# Patient Record
Sex: Female | Born: 1970 | Race: Black or African American | Hispanic: No | Marital: Single | Smoking: Never smoker
Health system: Southern US, Community
[De-identification: ages and names within clinical notes are randomized; demographics above are authoritative.]

## PROBLEM LIST (undated history)

## (undated) DIAGNOSIS — C50919 Malignant neoplasm of unspecified site of unspecified female breast: Secondary | ICD-10-CM

## (undated) HISTORY — PX: BREAST SURGERY: SHX581

---

## 2020-11-28 DIAGNOSIS — C50812 Malignant neoplasm of overlapping sites of left female breast: Secondary | ICD-10-CM

## 2020-11-28 DIAGNOSIS — Z170 Estrogen receptor positive status [ER+]: Secondary | ICD-10-CM

## 2021-10-08 ENCOUNTER — Inpatient Hospital Stay (HOSPITAL_COMMUNITY)
Admission: EM | Admit: 2021-10-08 | Discharge: 2021-10-13 | DRG: 025 | Disposition: A | Discharge status: Home / Self Care | Payer: 59 | Attending: Internal Medicine | Admitting: Internal Medicine

## 2021-10-08 ENCOUNTER — Emergency Department (HOSPITAL_COMMUNITY): Payer: 59

## 2021-10-08 ENCOUNTER — Encounter (HOSPITAL_COMMUNITY): Payer: Self-pay | Admitting: Internal Medicine

## 2021-10-08 ENCOUNTER — Other Ambulatory Visit: Payer: Self-pay

## 2021-10-08 DIAGNOSIS — Z6831 Body mass index (BMI) 31.0-31.9, adult: Secondary | ICD-10-CM

## 2021-10-08 DIAGNOSIS — E669 Obesity, unspecified: Secondary | ICD-10-CM | POA: Diagnosis present

## 2021-10-08 DIAGNOSIS — R739 Hyperglycemia, unspecified: Secondary | ICD-10-CM | POA: Diagnosis not present

## 2021-10-08 DIAGNOSIS — C7931 Secondary malignant neoplasm of brain: Principal | ICD-10-CM | POA: Diagnosis present

## 2021-10-08 DIAGNOSIS — G936 Cerebral edema: Secondary | ICD-10-CM | POA: Diagnosis present

## 2021-10-08 DIAGNOSIS — T380X5A Adverse effect of glucocorticoids and synthetic analogues, initial encounter: Secondary | ICD-10-CM | POA: Diagnosis not present

## 2021-10-08 DIAGNOSIS — Z20822 Contact with and (suspected) exposure to covid-19: Secondary | ICD-10-CM | POA: Diagnosis present

## 2021-10-08 DIAGNOSIS — W1830XA Fall on same level, unspecified, initial encounter: Secondary | ICD-10-CM | POA: Diagnosis present

## 2021-10-08 DIAGNOSIS — Z66 Do not resuscitate: Secondary | ICD-10-CM | POA: Diagnosis present

## 2021-10-08 DIAGNOSIS — G9389 Other specified disorders of brain: Secondary | ICD-10-CM

## 2021-10-08 DIAGNOSIS — G40909 Epilepsy, unspecified, not intractable, without status epilepticus: Secondary | ICD-10-CM | POA: Diagnosis present

## 2021-10-08 DIAGNOSIS — Z79899 Other long term (current) drug therapy: Secondary | ICD-10-CM | POA: Diagnosis not present

## 2021-10-08 DIAGNOSIS — Y929 Unspecified place or not applicable: Secondary | ICD-10-CM

## 2021-10-08 DIAGNOSIS — E872 Acidosis, unspecified: Secondary | ICD-10-CM | POA: Diagnosis present

## 2021-10-08 DIAGNOSIS — Z170 Estrogen receptor positive status [ER+]: Secondary | ICD-10-CM | POA: Diagnosis not present

## 2021-10-08 DIAGNOSIS — R569 Unspecified convulsions: Secondary | ICD-10-CM | POA: Diagnosis present

## 2021-10-08 DIAGNOSIS — C50812 Malignant neoplasm of overlapping sites of left female breast: Secondary | ICD-10-CM | POA: Diagnosis not present

## 2021-10-08 HISTORY — DX: Malignant neoplasm of unspecified site of unspecified female breast: C50.919

## 2021-10-08 LAB — CBC WITH DIFFERENTIAL/PLATELET
Abs Immature Granulocytes: 0.02 10*3/uL (ref 0.00–0.07)
Basophils Absolute: 0 10*3/uL (ref 0.0–0.1)
Basophils Relative: 0 %
Eosinophils Absolute: 0 10*3/uL (ref 0.0–0.5)
Eosinophils Relative: 1 %
HCT: 38.8 % (ref 36.0–46.0)
Hemoglobin: 12.9 g/dL (ref 12.0–15.0)
Immature Granulocytes: 0 %
Lymphocytes Relative: 13 %
Lymphs Abs: 0.6 10*3/uL — ABNORMAL LOW (ref 0.7–4.0)
MCH: 31.7 pg (ref 26.0–34.0)
MCHC: 33.2 g/dL (ref 30.0–36.0)
MCV: 95.3 fL (ref 80.0–100.0)
Monocytes Absolute: 0.3 10*3/uL (ref 0.1–1.0)
Monocytes Relative: 6 %
Neutro Abs: 4 10*3/uL (ref 1.7–7.7)
Neutrophils Relative %: 80 %
Platelets: 197 10*3/uL (ref 150–400)
RBC: 4.07 MIL/uL (ref 3.87–5.11)
RDW: 12.9 % (ref 11.5–15.5)
WBC: 5 10*3/uL (ref 4.0–10.5)
nRBC: 0 % (ref 0.0–0.2)

## 2021-10-08 LAB — COMPREHENSIVE METABOLIC PANEL
ALT: 10 U/L (ref 0–44)
AST: 44 U/L — ABNORMAL HIGH (ref 15–41)
Albumin: 3.7 g/dL (ref 3.5–5.0)
Alkaline Phosphatase: 54 U/L (ref 38–126)
Anion gap: 15 (ref 5–15)
BUN: 12 mg/dL (ref 6–20)
CO2: 18 mmol/L — ABNORMAL LOW (ref 22–32)
Calcium: 9.3 mg/dL (ref 8.9–10.3)
Chloride: 104 mmol/L (ref 98–111)
Creatinine, Ser: 0.9 mg/dL (ref 0.44–1.00)
GFR, Estimated: >60 mL/min (ref >60)
Glucose, Bld: 128 mg/dL — ABNORMAL HIGH (ref 70–99)
Potassium: 4.6 mmol/L (ref 3.5–5.1)
Sodium: 137 mmol/L (ref 135–145)
Total Bilirubin: 1.5 mg/dL — ABNORMAL HIGH (ref 0.3–1.2)
Total Protein: 6.8 g/dL (ref 6.5–8.1)

## 2021-10-08 LAB — I-STAT BETA HCG BLOOD, ED (MC, WL, AP ONLY): I-stat hCG, quantitative: <5 m[IU]/mL (ref <5)

## 2021-10-08 LAB — I-STAT CHEM 8, ED
BUN: 15 mg/dL (ref 6–20)
Calcium, Ion: 1.13 mmol/L — ABNORMAL LOW (ref 1.15–1.40)
Chloride: 106 mmol/L (ref 98–111)
Creatinine, Ser: 0.8 mg/dL (ref 0.44–1.00)
Glucose, Bld: 136 mg/dL — ABNORMAL HIGH (ref 70–99)
HCT: 38 % (ref 36.0–46.0)
Hemoglobin: 12.9 g/dL (ref 12.0–15.0)
Potassium: 4.3 mmol/L (ref 3.5–5.1)
Sodium: 138 mmol/L (ref 135–145)
TCO2: 23 mmol/L (ref 22–32)

## 2021-10-08 LAB — ETHANOL: Alcohol, Ethyl (B): <10 mg/dL (ref <10)

## 2021-10-08 LAB — RESP PANEL BY RT-PCR (FLU A&B, COVID) ARPGX2
Influenza A by PCR: NEGATIVE
Influenza B by PCR: NEGATIVE
SARS Coronavirus 2 by RT PCR: NEGATIVE

## 2021-10-08 LAB — CBG MONITORING, ED: Glucose-Capillary: 133 mg/dL — ABNORMAL HIGH (ref 70–99)

## 2021-10-08 IMAGING — CT CT HEAD W/O CM
3 of 4 series · 14 of 47 positions shown, 16 images · non-contrast
Comparison: None.

CLINICAL DATA: Mental status change, unknown cause possible
seizure, h/o breast cancer



[Series 5: head 3.0 mpr cor · coronal · 0.33mm/px · 3 of 69 slices shown]
[im 23/69  brain]
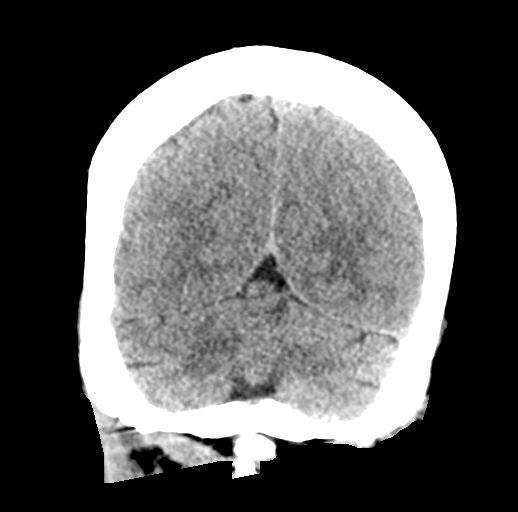
[im 31/69  brain]
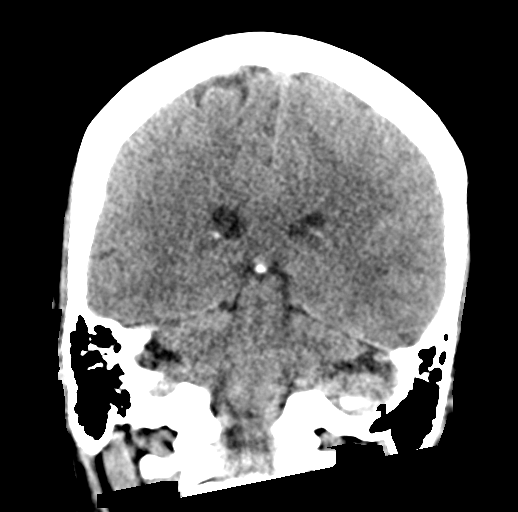
[im 38/69  brain]
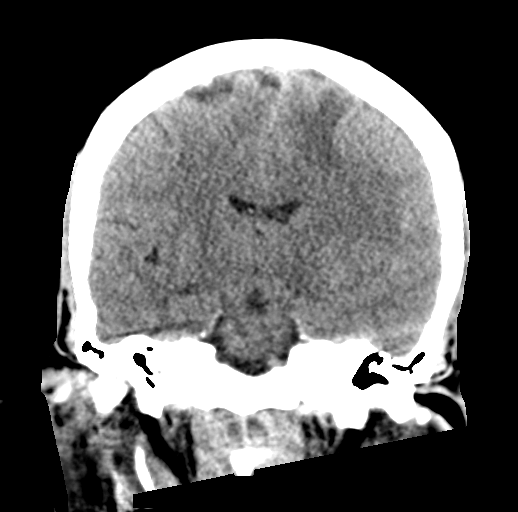

[Series 6: head 3.0 mpr sag · sagittal · 0.34mm/px · 3 of 61 slices shown]
[im 23/61  brain]
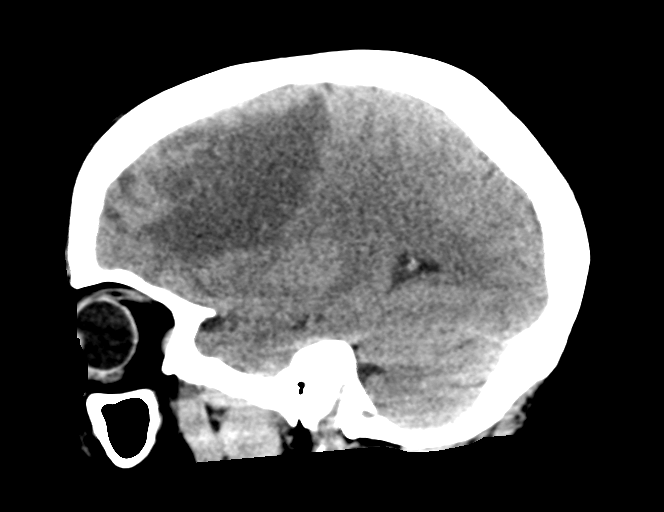
[im 31/61  brain]
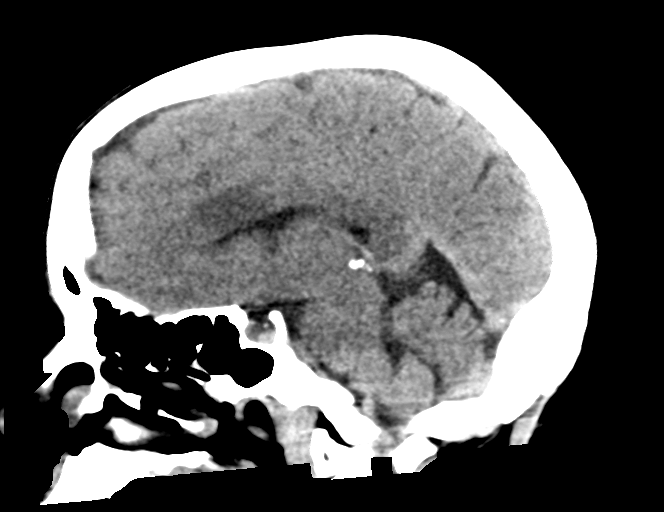
[im 38/61  brain]
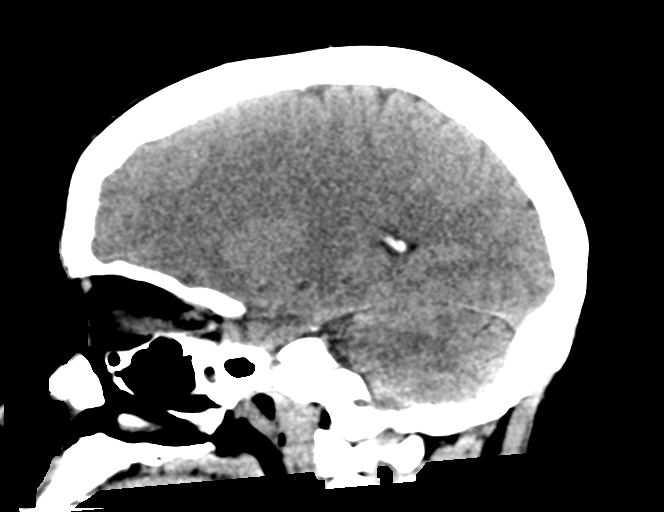

[Series 7: true axial · axial · 0.32mm/px · z∈[-98,+40]mm · 8 of 1701 slices shown, 10 images]
[im 122/1701  brain]
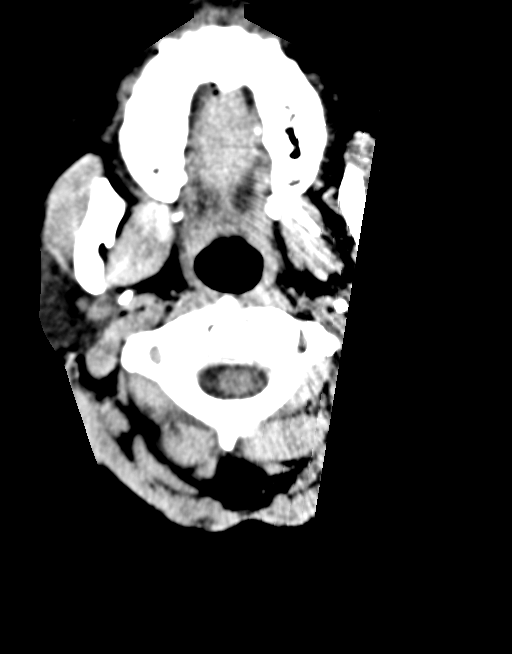
[im 122/1701  bone]
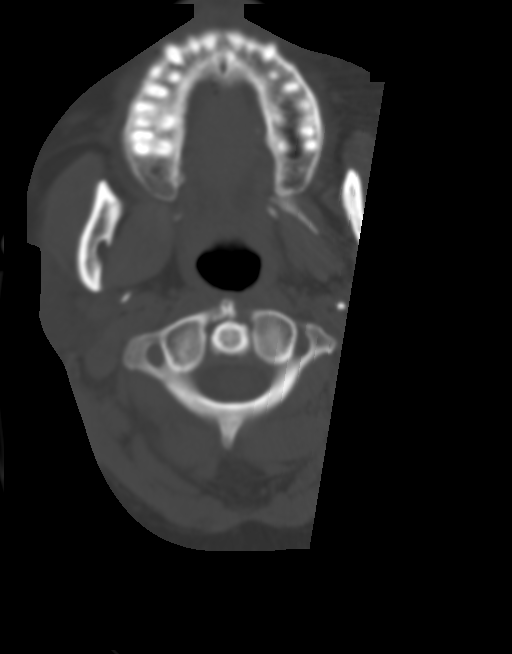
[im 365/1701  brain]
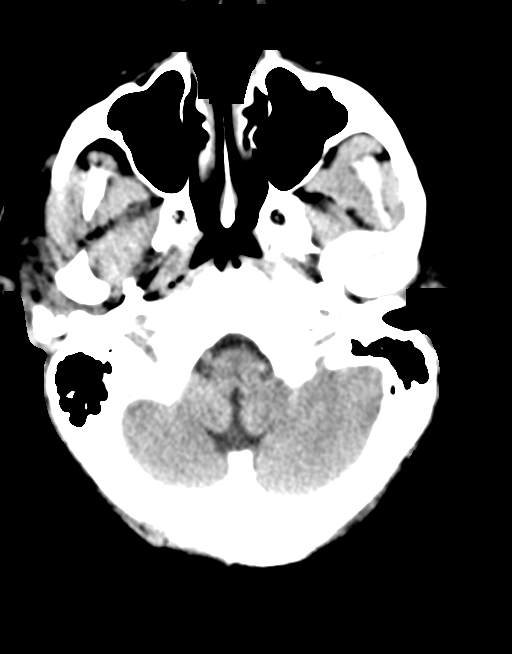
[im 547/1701  brain]
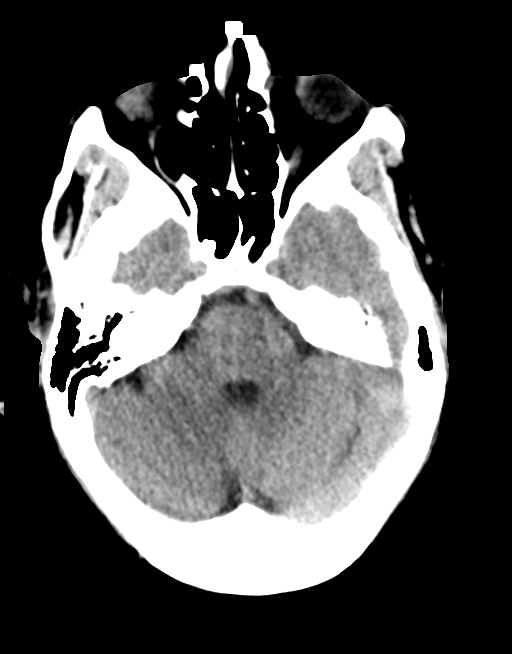
[im 729/1701  brain]
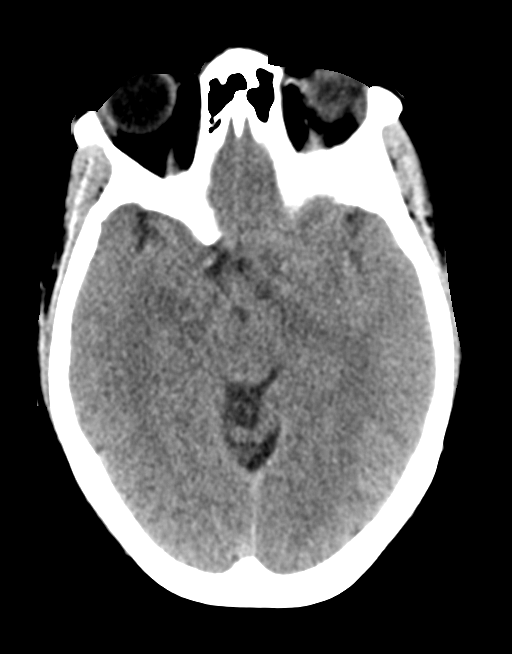
[im 972/1701  brain]
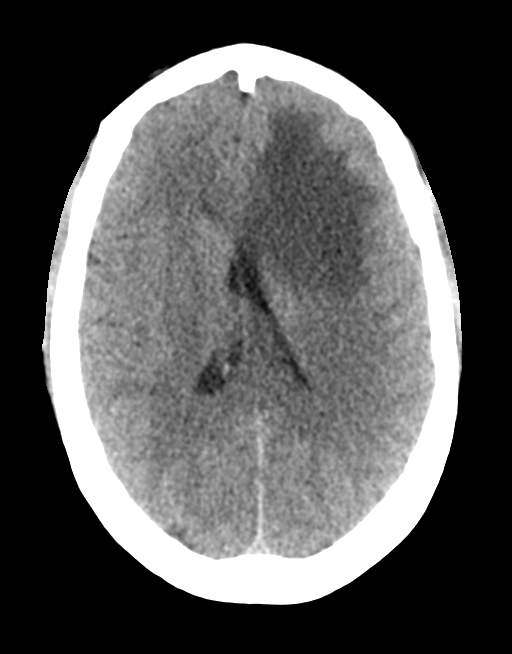
[im 972/1701  bone]
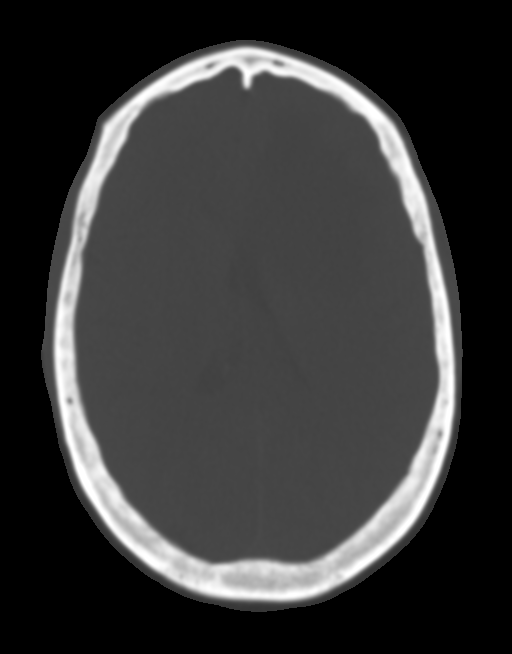
[im 1154/1701  brain]
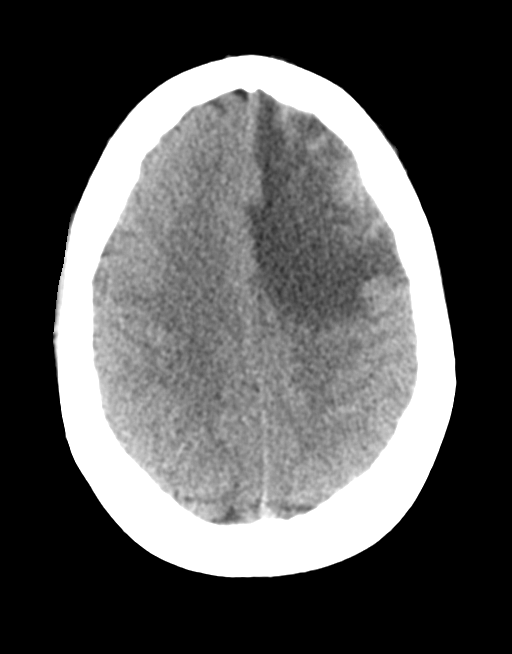
[im 1336/1701  brain]
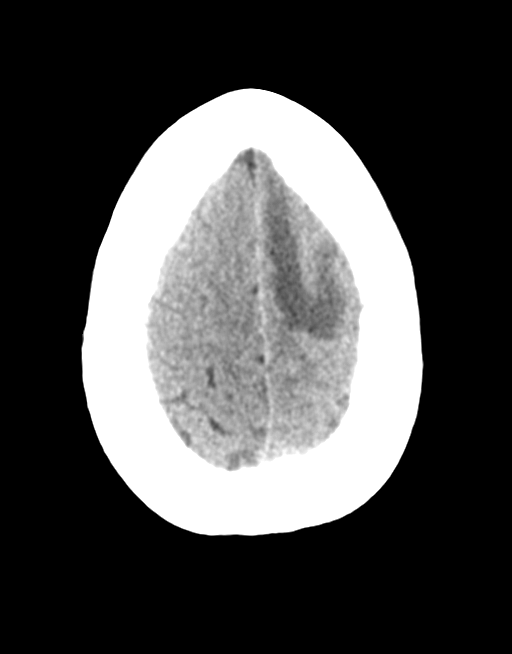
[im 1579/1701  brain]
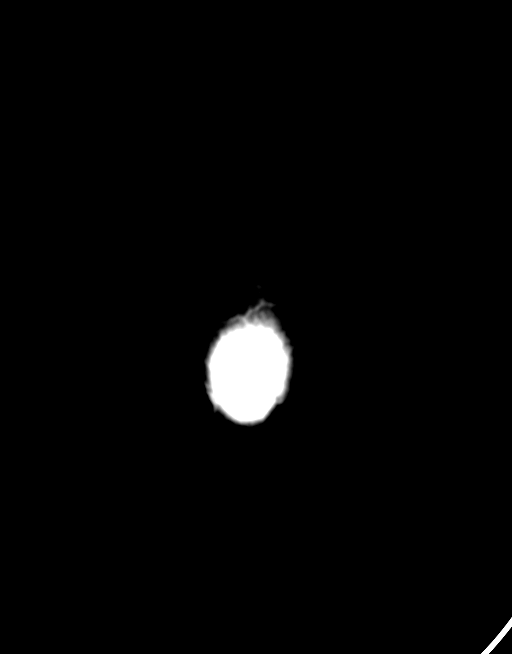

[14 of 47 positions shown; findings below may reference images not displayed]

FINDINGS: Brain: There is significant edema in the left frontal lobe. There
could be some loss of gray differentiation is region. Effacement of
adjacent ventricles with rightward midline shift measuring 4 mm. No
hydrocephalus.

Remainder of brain is unremarkable. No acute intracranial
hemorrhage.

Vascular: There is mild atherosclerotic calcification at the skull
base.

Skull: Calvarium is unremarkable.

Sinuses/Orbits: No acute finding.

Other: None.
IMPRESSION: Left frontal edema, probably vasogenic and secondary to an
underlying metastasis given history. Regional mass effect is present
with mild rightward midline shift. Contrast enhanced MRI is
recommended.

These results were called by telephone at the time of interpretation
on [DATE] at [DATE] to provider JIM , who verbally
acknowledged these results.

## 2021-10-08 IMAGING — CT CT CERVICAL SPINE W/O CM
3 of 4 series · 13 of 33 positions shown, 16 images · non-contrast
Comparison: None.

CLINICAL DATA: Neck trauma, dangerous injury mechanism (Age 16-64y)



[Series 5: c_spine 2.0 st · axial · 0.40mm/px · z∈[-241,-123]mm · 5 of 89 slices shown, 7 images]
[im 15/89  soft-tissue]
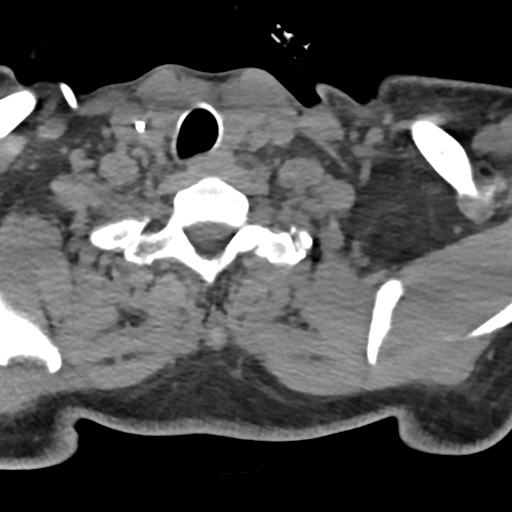
[im 15/89  bone]
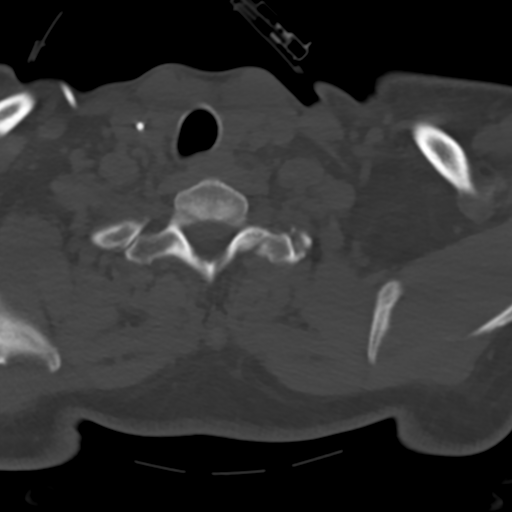
[im 30/89  bone]
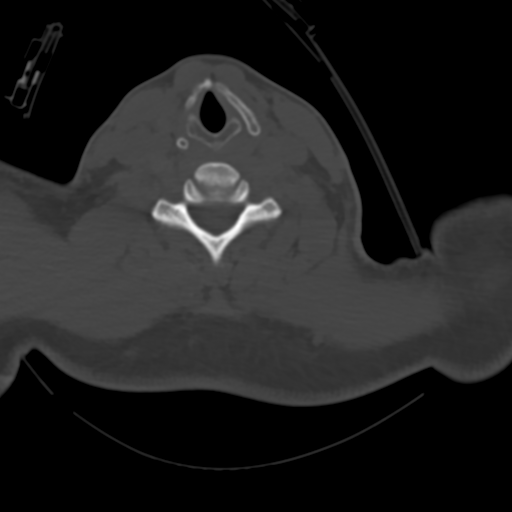
[im 45/89  bone]
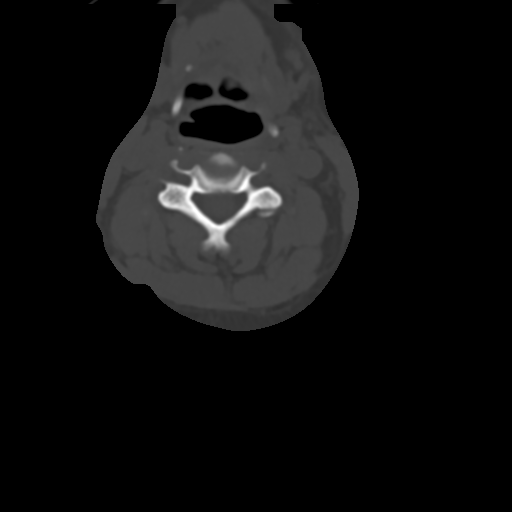
[im 59/89  bone]
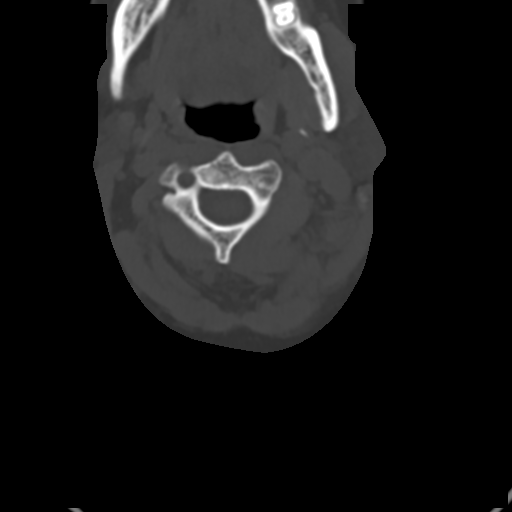
[im 74/89  soft-tissue]
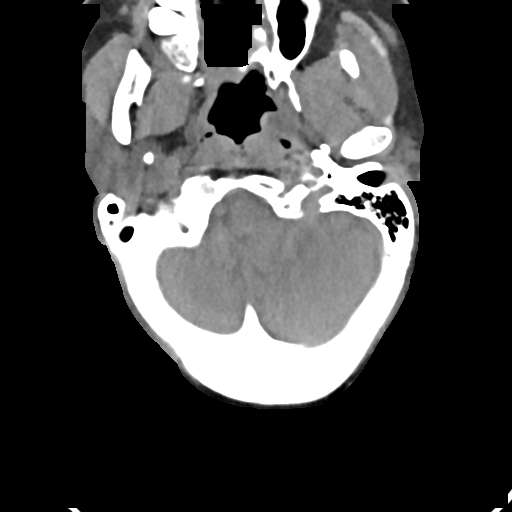
[im 74/89  bone]
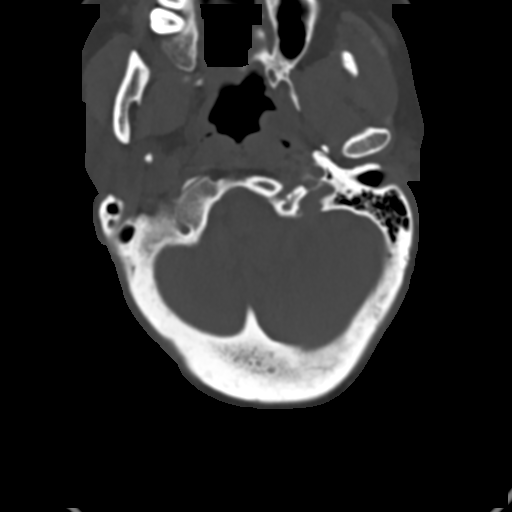

[Series 6: coronal bone · coronal · 0.31mm/px · 3 of 61 slices shown]
[im 13/61  bone]
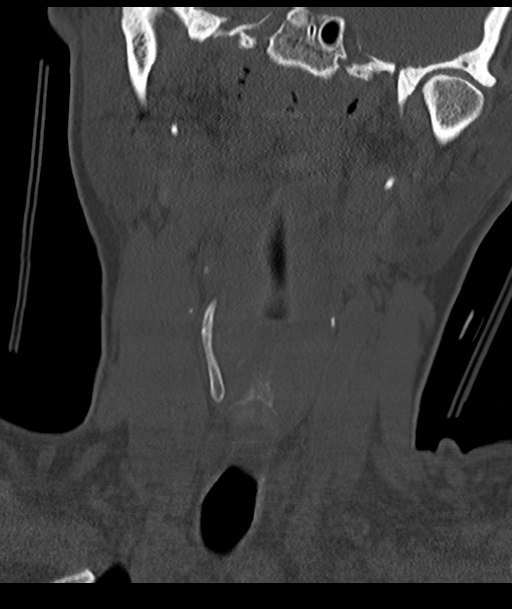
[im 25/61  bone]
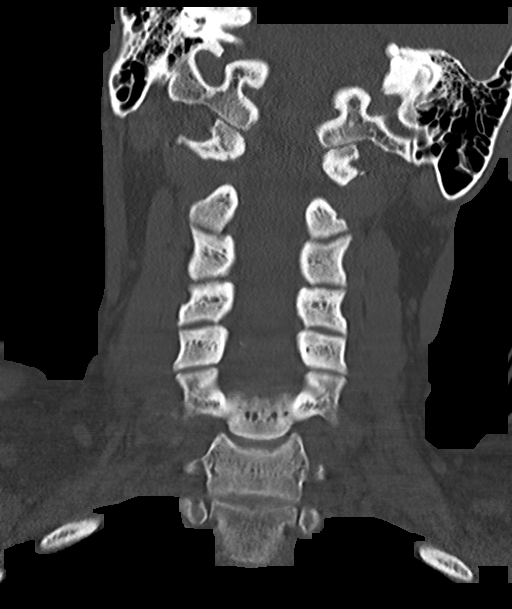
[im 37/61  bone]
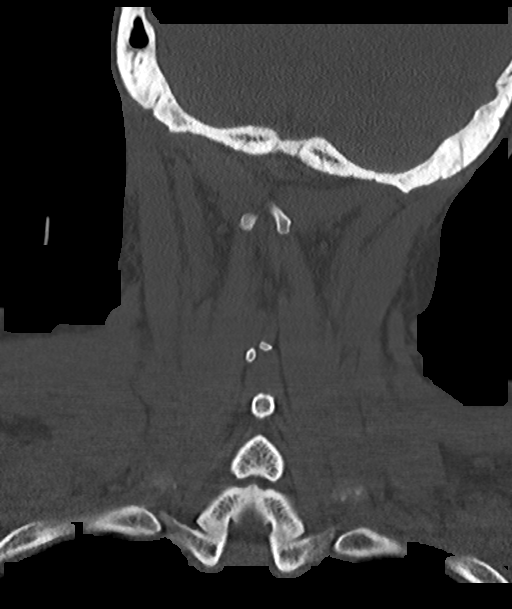

[Series 7: sagittal bone · sagittal · 0.26mm/px · 5 of 61 slices shown, 6 images]
[im 21/61  bone]
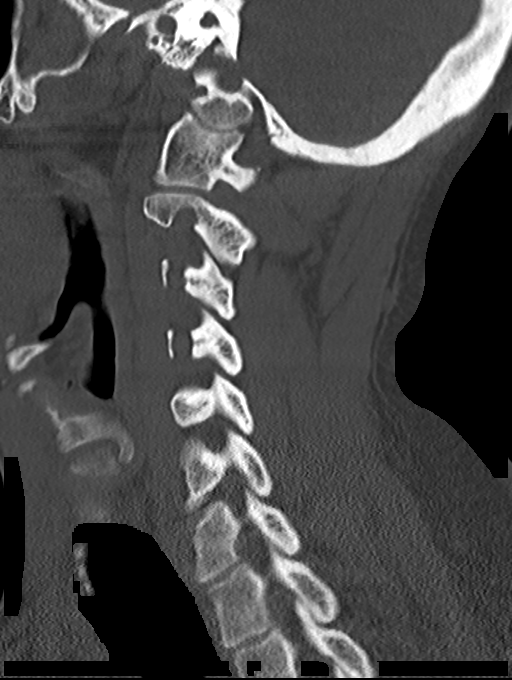
[im 26/61  bone]
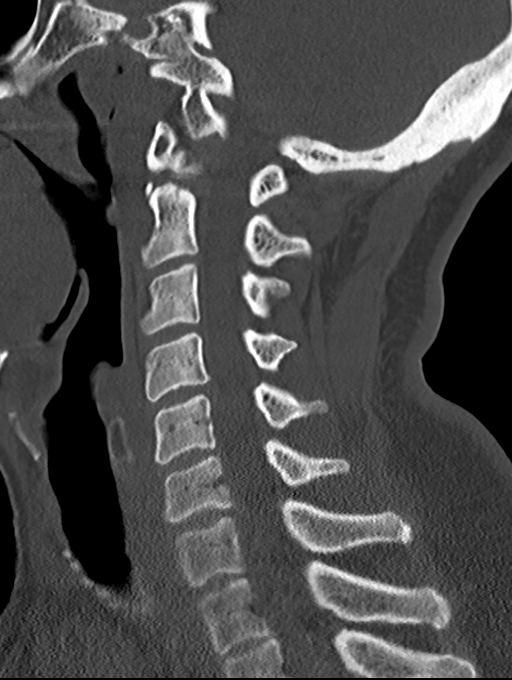
[im 31/61  soft-tissue]
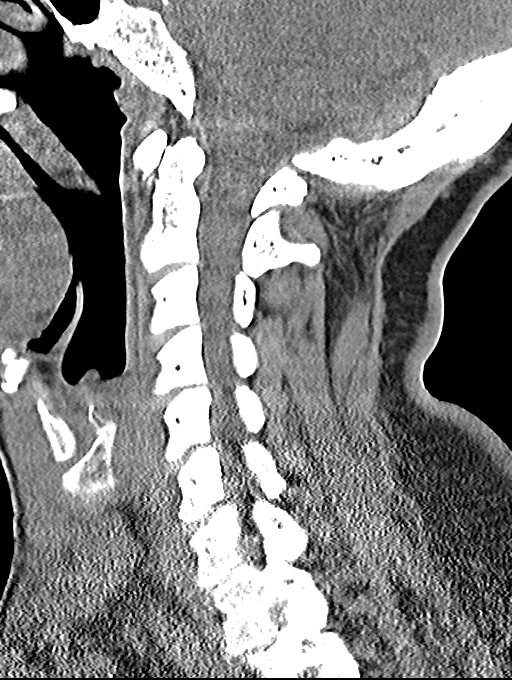
[im 31/61  bone]
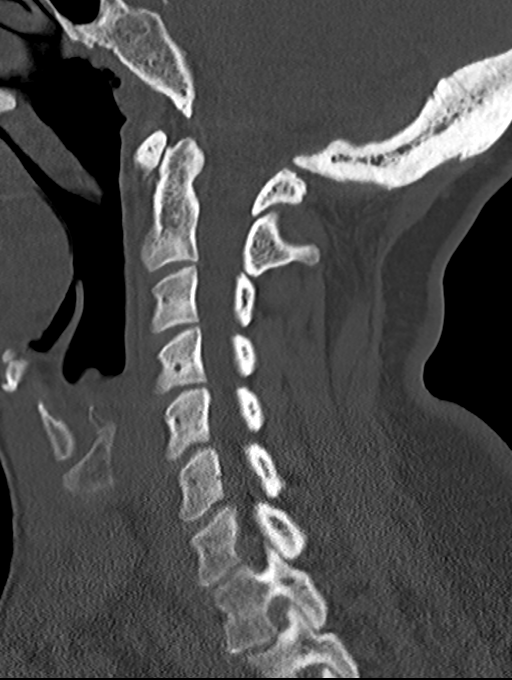
[im 36/61  bone]
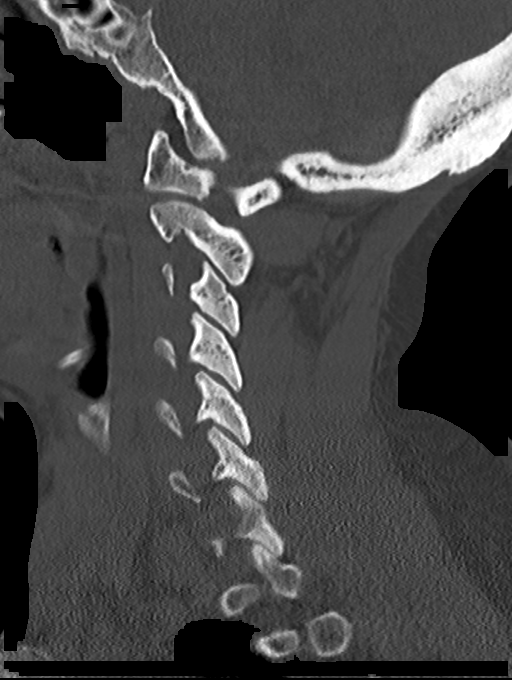
[im 41/61  bone]
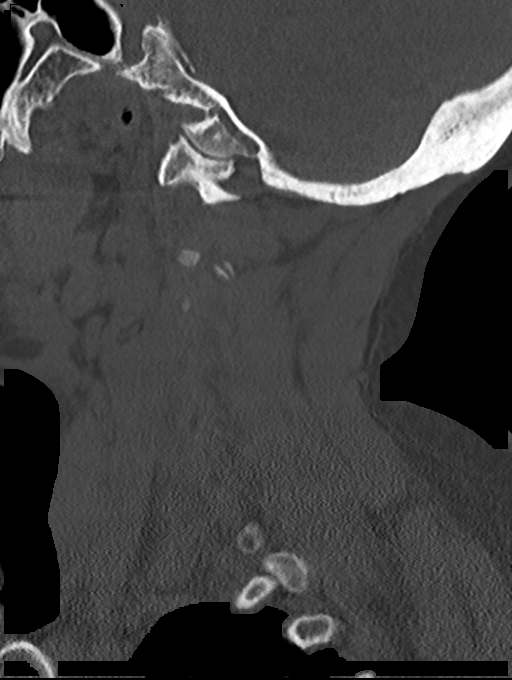

[13 of 33 positions shown; findings below may reference images not displayed]

FINDINGS: Alignment: Preserved.

Skull base and vertebrae: Vertebral body heights are maintained. No
acute fracture. No destructive osseous lesion.

Soft tissues and spinal canal: No prevertebral fluid or swelling. No
visible canal hematoma.

Disc levels:  Mild degenerative changes at C4-C5.

Upper chest: No apical lung mass.

Other: Partially imaged right chest wall port catheter.
IMPRESSION: No acute cervical spine fracture.

## 2021-10-08 IMAGING — MR MR HEAD WO/W CM
18 of 20 series · 42 of 48 positions shown · IV contrast (gadavist)
Comparison: [DATE]

CLINICAL DATA: Mental status change, history of breast cancer,
seizure, possible metastasis on head CT

EXAM:
MRI HEAD WITHOUT AND WITH CONTRAST
TECHNIQUE: Multiplanar, multiecho pulse sequences of the brain and surrounding
structures were obtained without and with intravenous contrast.
CONTRAST:  7.5mL GADAVIST GADOBUTROL 1 MMOL/ML IV SOLN

[Series 5: DWI · axial · 3.0mm · 0.88mm/px · z∈[-132,+5]mm · 5 of 96 slices shown (1 of 4)]
[im 1/96]
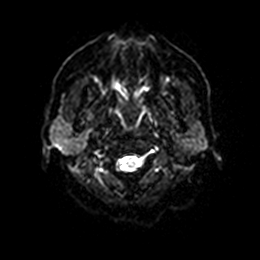
[im 24/96]
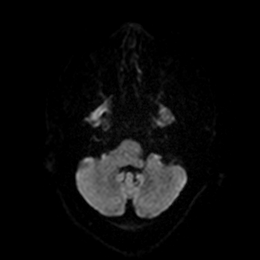
[im 48/96]
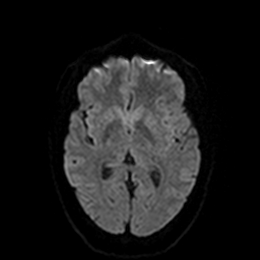
[im 72/96]
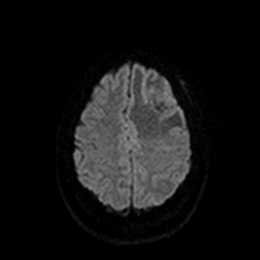
[im 96/96]
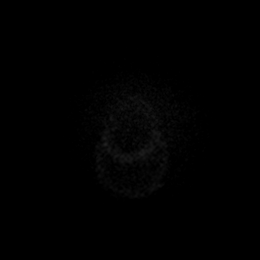

[Series 6: DWI · axial · 3.0mm · 0.88mm/px · z∈[-132,+5]mm · 3 of 48 slices shown (2 of 4)]
[im 1/48]
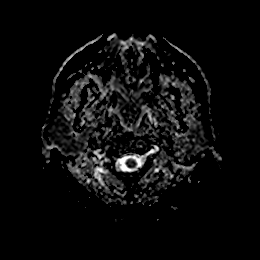
[im 24/48]
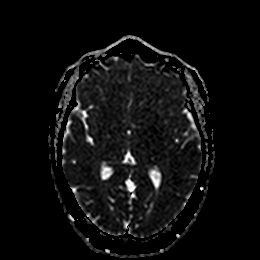
[im 48/48]
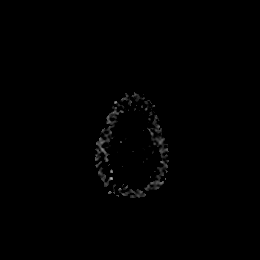

[Series 7: DWI · coronal · 4.0mm · 0.88mm/px · 4 of 68 slices shown (3 of 4)]
[im 1/68]
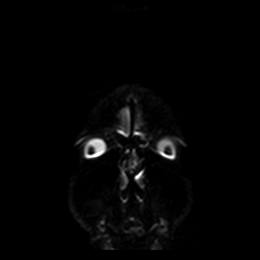
[im 23/68]
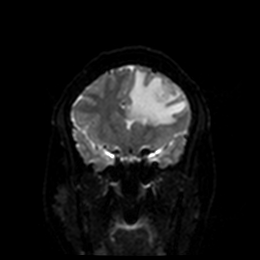
[im 45/68]
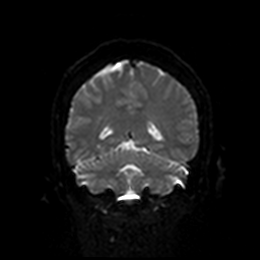
[im 68/68]
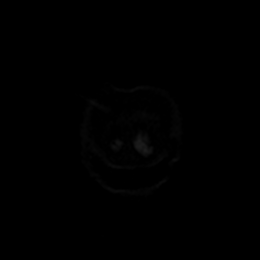

[Series 8: DWI · coronal · 4.0mm · 0.88mm/px · 1 of 34 slices shown (4 of 4)]
[im 1/34]
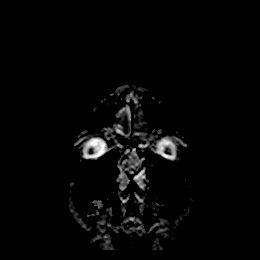

[Series 9: T1 · sagittal · 5.0mm · 0.75mm/px · 1 of 23 slices shown]
[im 1/23]
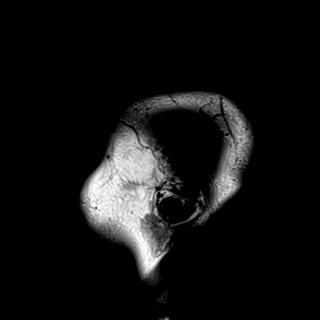

[Series 10: T2 · axial · 5.0mm · 0.72mm/px · 1 of 25 slices shown (1 of 2)]
[im 1/25]
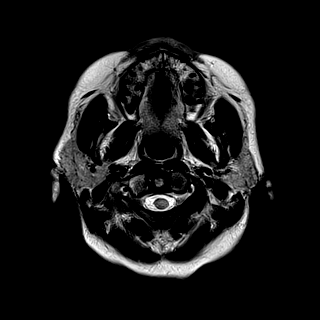

[Series 11: FLAIR · axial · 5.0mm · 0.45mm/px · 1 of 25 slices shown (1 of 2)]
[im 1/25]
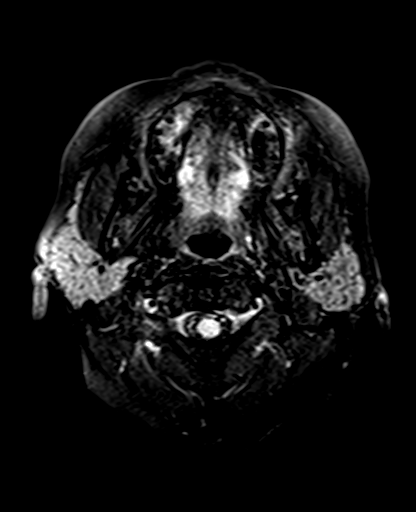

[Series 12: mag_images · axial · 3.0mm · 0.90mm/px · z∈[-138,+10]mm · 2 of 52 slices shown]
[im 1/52]
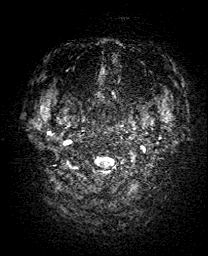
[im 52/52]
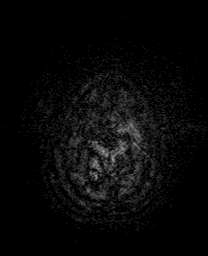

[Series 13: pha_images · axial · 3.0mm · 0.90mm/px · z∈[-138,+10]mm · 2 of 52 slices shown]
[im 1/52]
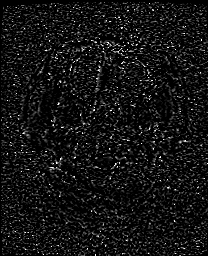
[im 52/52]
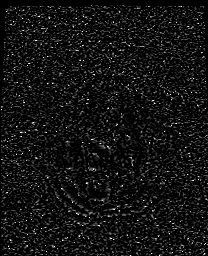

[Series 14: swi_images · axial · 3.0mm · 0.90mm/px · z∈[-138,+10]mm · 2 of 52 slices shown]
[im 1/52]
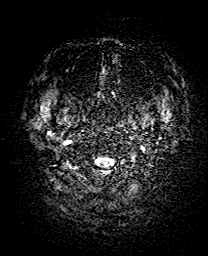
[im 52/52]
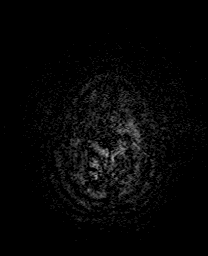

[Series 15: mip_images(sw) · axial · 24.0mm · 0.90mm/px · z∈[-128,+0]mm · 2 of 45 slices shown]
[im 1/45]
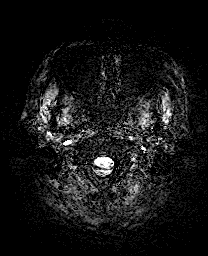
[im 45/45]
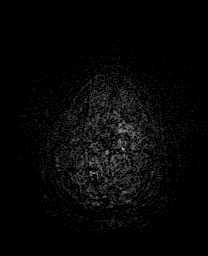

[Series 17: t1_mprage_tra_p2_iso · axial · 1.0mm · 0.98mm/px · z∈[-172,-11]mm · 8 of 175 slices shown]
[im 1/175]
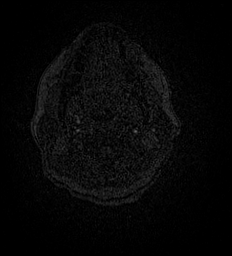
[im 25/175]
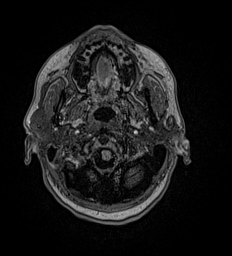
[im 50/175]
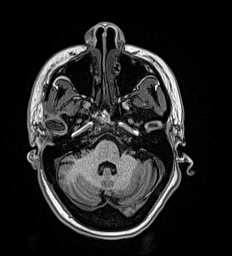
[im 75/175]
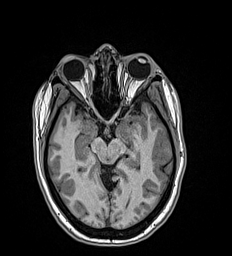
[im 100/175]
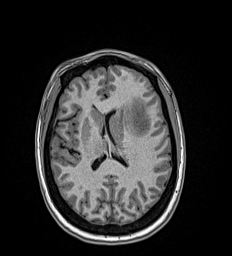
[im 125/175]
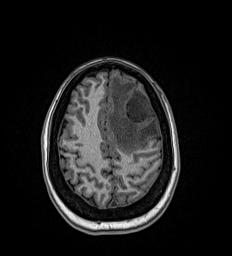
[im 150/175]
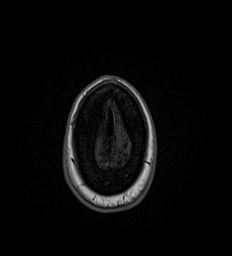
[im 175/175]
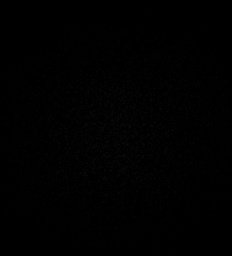

[Series 18: t1_mprage_tra_p2_iso_mpr_coronal · coronal · 1.0mm · 0.45mm/px · 5 of 169 slices shown]
[im 1/169]
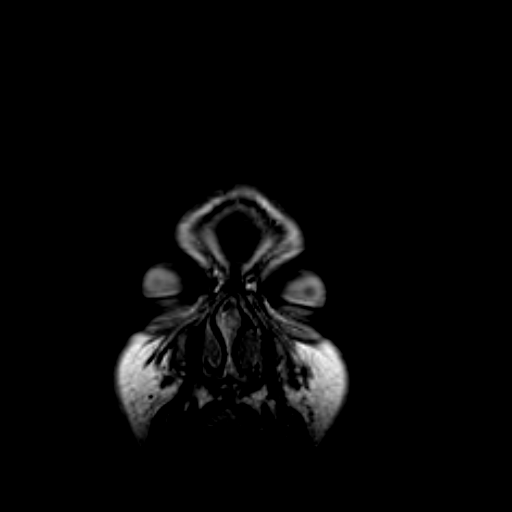
[im 29/169]
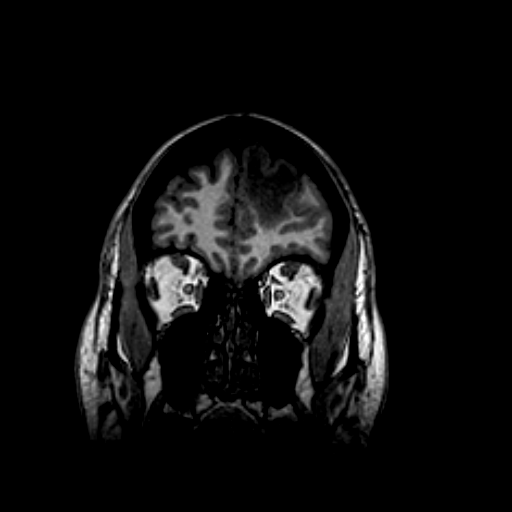
[im 57/169]
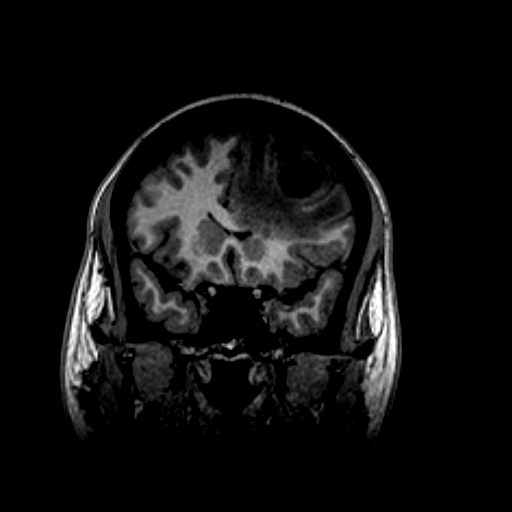
[im 85/169]
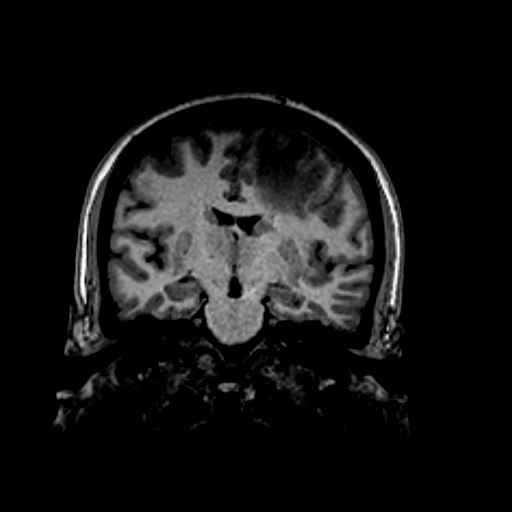
[im 113/169]
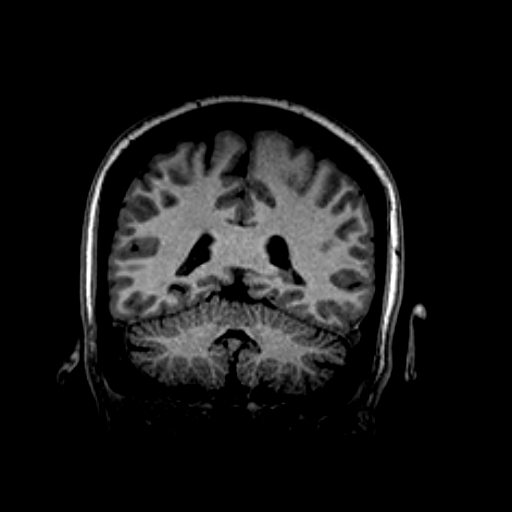

[Series 20: FLAIR · oblique · 3.0mm · 0.56mm/px · 1 of 33 slices shown (2 of 2)]
[im 1/33]
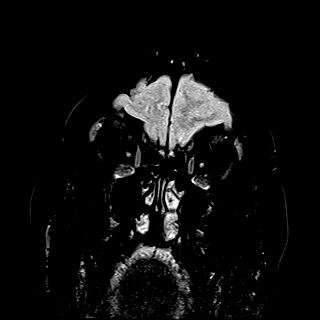

[Series 21: T2 · oblique · 3.0mm · 0.27mm/px · 1 of 32 slices shown (2 of 2)]
[im 1/32]
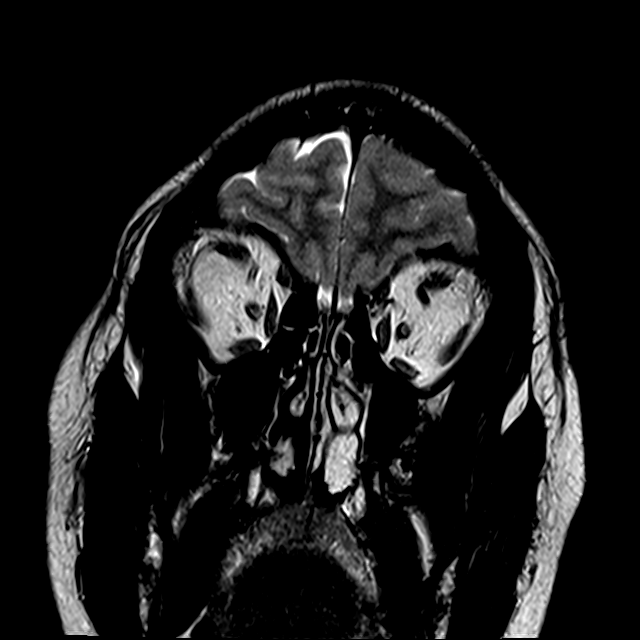

[Series 22: T2 post-contrast · coronal · 5.0mm · 0.72mm/px · 1 of 28 slices shown]
[im 1/28]
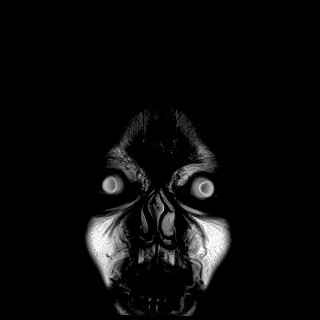

[Series 24: T1 post-contrast · coronal · 5.0mm · 0.34mm/px · 1 of 28 slices shown (1 of 2)]
[im 1/28]
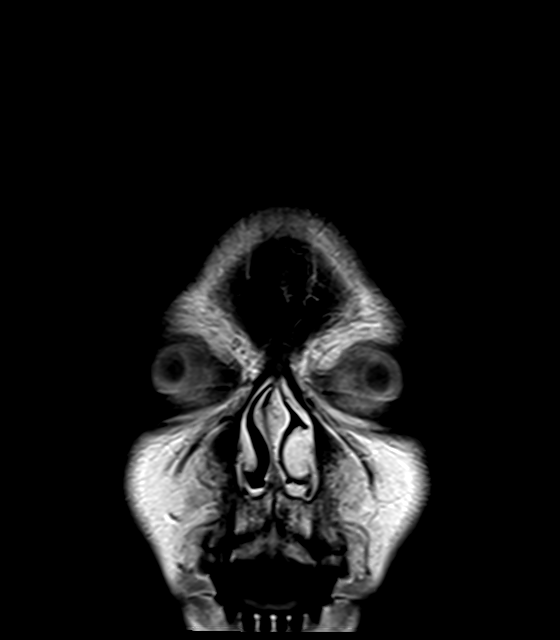

[Series 25: T1 post-contrast · sagittal · 5.0mm · 0.72mm/px · 1 of 23 slices shown (2 of 2)]
[im 1/23]
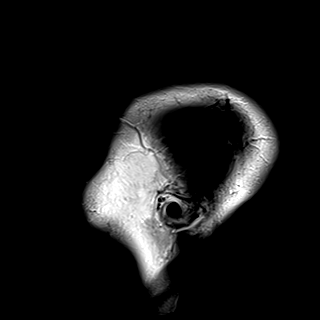

[42 of 48 positions shown; findings below may reference images not displayed]

FINDINGS: Brain: Heterogeneously enhancing mass in the left frontal lobe,
which measures 2.8 x 2.1 x 2.0 cm (AP x TR x CC) (series 23, image
37 and series 24, image 20). Significant surrounding T2 hyperintense
signal, most likely edema, which causes local mass effect with
effacement of the left-greater-than-right lateral ventricle and
approximately 5 mm of left-to-right midline shift.

No restricted diffusion to suggest acute infarct. No acute
hemorrhage. No hydrocephalus or extra-axial collection.

The hippocampi are symmetric in size and normal in signal. No
evidence of gray matter heterotopia or underlying cortical
dysplasia.

Vascular: Normal flow voids. The ACA flow voids are deviated to the
right by the edema surrounding the mass, but appear patent.

Skull and upper cervical spine: Normal marrow signal.

Sinuses/Orbits: Negative.

Other: The mastoids are well aerated.
IMPRESSION: 1. 2.8 cm left frontal lobe mass, favored to represent a metastatic
lesion, given the patient's history of breast cancer and degree of
surrounding edema, versus a primary CNS neoplasm.
2. Edema surrounding left frontal lobe mass with local mass effect,
effacement of the left-greater-than-right lateral ventricles, and 5
mm of left-to-right midline shift.
3. No other seizure etiology identified.

## 2021-10-08 MED ORDER — DEXAMETHASONE SODIUM PHOSPHATE 10 MG/ML IJ SOLN
10.0000 mg | Freq: Once | INTRAMUSCULAR | Status: AC
  Administered 2021-10-08: 19:00:00 10 mg via INTRAVENOUS
  Filled 2021-10-08: qty 1

## 2021-10-08 MED ORDER — DEXAMETHASONE SODIUM PHOSPHATE 10 MG/ML IJ SOLN
8.0000 mg | Freq: Two times a day (BID) | INTRAMUSCULAR | Status: DC
  Administered 2021-10-09 – 2021-10-12 (×7): 8 mg via INTRAVENOUS
  Filled 2021-10-08 (×7): qty 1

## 2021-10-08 MED ORDER — LORAZEPAM 2 MG/ML IJ SOLN
INTRAMUSCULAR | Status: AC
  Administered 2021-10-08: 16:00:00 2 mg
  Filled 2021-10-08: qty 1

## 2021-10-08 MED ORDER — LACTATED RINGERS IV SOLN: INTRAVENOUS | Status: DC

## 2021-10-08 MED ORDER — ONDANSETRON HCL 4 MG/2ML IJ SOLN: 4.0000 mg | Freq: Four times a day (QID) | INTRAMUSCULAR | Status: DC | PRN

## 2021-10-08 MED ORDER — LORAZEPAM 2 MG/ML IJ SOLN: 2.0000 mg | Freq: Once | INTRAMUSCULAR | Status: AC

## 2021-10-08 MED ORDER — ACETAMINOPHEN 10 MG/ML IV SOLN: 1000.0000 mg | Freq: Four times a day (QID) | INTRAVENOUS | Status: AC | PRN

## 2021-10-08 MED ORDER — LORAZEPAM 2 MG/ML IJ SOLN
1.0000 mg | Freq: Once | INTRAMUSCULAR | Status: AC
  Administered 2021-10-08: 19:00:00 1 mg via INTRAVENOUS
  Filled 2021-10-08: qty 1

## 2021-10-08 MED ORDER — GADOBUTROL 1 MMOL/ML IV SOLN
7.5000 mL | Freq: Once | INTRAVENOUS | Status: AC | PRN
  Administered 2021-10-08: 21:00:00 7.5 mL via INTRAVENOUS

## 2021-10-08 MED ORDER — LEVETIRACETAM IN NACL 1000 MG/100ML IV SOLN
1000.0000 mg | INTRAVENOUS | Status: AC
  Administered 2021-10-08 (×2): 1000 mg via INTRAVENOUS
  Filled 2021-10-08: qty 100

## 2021-10-08 NOTE — H&P (Signed)
History and Physical    Shannon Hayes DJM:[id number] DOB: 1971-09-15 DOA: 10/08/2021  PCP: Pcp, No   Patient coming from:  work at [employer]. Lives at home alone  I have personally briefly reviewed patient's old medical records in [place]  CC: seizure HPI: [name] with hx of breast cancer s/p lumpectomy, XRT, chemo followed by Dr. Marylou Mccoy with [facility] heme/onc, presents to the ER today from her office at [facility].  Patient reportedly had a seizure and fell to the ground.  Patient was brought in unconscious.  Patient lives in [place] but works in [place].  Her base hospital is [facility] in [place].  Laboratory evaluation here in the ER showed bicarbonate of 18, glucose of 128 otherwise unremarkable.  CT head showed large frontal edema.  With regional mass-effect and right midline shift.  Brain MRI demonstrated 2.8 cm left frontal lobe mass.  There is surrounding vasogenic edema.  There is effacement of the ventricle.  With a midline shift.  Neurology and neurosurgery both consulted.  Patient loaded with IV Ativan and Keppra.  [facility] contacted for admission.   ED Course: CT head shows frontal mass and edema. MRI shows brain mass. Neurology and neurosurgery have been consulted.  Review of Systems:  Review of Systems  Unable to perform ROS: Other  due to sedation.  Past Medical History:  Diagnosis Date   Breast cancer [facility])      reports that she has never smoked. She has never used smokeless tobacco. She reports that she does not drink alcohol and does not use drugs.  No Known Allergies  No family history on file.  Prior to Admission medications   Medication Sig Start Date End Date Taking? Authorizing Provider  abemaciclib (VERZENIO) 150 MG tablet Take 150 mg by mouth in the morning and at bedtime. 09/27/21  Yes [provider]  letrozole (FEMARA) 2.5 MG tablet Take 2.5 mg by mouth daily. 09/11/21 09/11/22 Yes [provider]    Physical Exam: Vitals:   10/08/21 1815 10/08/21 1830 10/08/21 2000 10/08/21 2006  BP: 125/84 112/90 109/67   Pulse: 80 76 90 85  Resp: (!) 21 14 16 19   Temp:      TempSrc:      SpO2: 100% 100% 98% 100%  Weight:      Height:        Physical Exam Vitals and nursing note reviewed.  Constitutional:      General: She is not in acute distress.    Appearance: She is not ill-appearing, toxic-appearing or diaphoretic.  HENT:     Head: Normocephalic and atraumatic.  Eyes:     Pupils: Pupils are equal, round, and reactive to light.  Cardiovascular:     Rate and Rhythm: Normal rate and regular rhythm.  Pulmonary:     Effort: Pulmonary effort is normal. No respiratory distress.     Breath sounds: Normal breath sounds. No wheezing or rales.  Abdominal:     General: Bowel sounds are normal. There is no distension.     Tenderness: There is no abdominal tenderness. There is no guarding.  Musculoskeletal:     Right lower leg: No edema.     Left lower leg: No edema.  Skin:    General: Skin is warm and dry.  Neurological:     Comments: sedated     Labs on Admission: I have personally reviewed following labs and imaging studies  CBC: Recent Labs  Lab 10/08/21 1627 10/08/21  1634  WBC 5.0  --   NEUTROABS 4.0  --   HGB 12.9 12.9  HCT 38.8 38.0  MCV 95.3  --   PLT 197  --    Basic Metabolic Panel: Recent Labs  Lab 10/08/21 1627 10/08/21 1634  NA 137 138  K 4.6 4.3  CL 104 106  CO2 18*  --   GLUCOSE 128* 136*  BUN 12 15  CREATININE 0.90 0.80  CALCIUM 9.3  --    GFR: Estimated Creatinine Clearance: 86.8 mL/min (by C-G formula based on SCr of 0.8 mg/dL). Liver Function Tests: Recent Labs  Lab 10/08/21 1627  AST 44*  ALT 10  ALKPHOS 54  BILITOT 1.5*  PROT 6.8  ALBUMIN 3.7   No results for input(s): LIPASE, AMYLASE in the last 168 hours. No results for input(s): AMMONIA in the last 168 hours. Coagulation Profile: No results for input(s): INR,  PROTIME in the last 168 hours. Cardiac Enzymes: No results for input(s): CKTOTAL, CKMB, CKMBINDEX, TROPONINI in the last 168 hours. BNP (last 3 results) No results for input(s): PROBNP in the last 8760 hours. HbA1C: No results for input(s): HGBA1C in the last 72 hours. CBG: Recent Labs  Lab 10/08/21 1607  GLUCAP 133*   Lipid Profile: No results for input(s): CHOL, HDL, LDLCALC, TRIG, CHOLHDL, LDLDIRECT in the last 72 hours. Thyroid Function Tests: No results for input(s): TSH, T4TOTAL, FREET4, T3FREE, THYROIDAB in the last 72 hours. Anemia Panel: No results for input(s): VITAMINB12, FOLATE, FERRITIN, TIBC, IRON, RETICCTPCT in the last 72 hours. Urine analysis: No results found for: COLORURINE, APPEARANCEUR, LABSPEC, [place], GLUCOSEU, HGBUR, BILIRUBINUR, KETONESUR, PROTEINUR, UROBILINOGEN, NITRITE, LEUKOCYTESUR  Radiological Exams on Admission: I have personally reviewed images CT Head Wo Contrast  Result Date: 10/08/2021 CLINICAL DATA:  Mental status change, unknown cause possible seizure, h/o breast cancer EXAM: CT HEAD WITHOUT CONTRAST TECHNIQUE: Contiguous axial images were obtained from the base of the skull through the vertex without intravenous contrast. RADIATION DOSE REDUCTION: This exam was performed according to the departmental dose-optimization program which includes automated exposure control, adjustment of the mA and/or kV according to patient size and/or use of iterative reconstruction technique. COMPARISON:  None. FINDINGS: Brain: There is significant edema in the left frontal lobe. There could be some loss of gray differentiation is region. Effacement of adjacent ventricles with rightward midline shift measuring 4 mm. No hydrocephalus. Remainder of brain is unremarkable. No acute intracranial hemorrhage. Vascular: There is mild atherosclerotic calcification at the skull base. Skull: Calvarium is unremarkable. Sinuses/Orbits: No acute finding. Other: None. IMPRESSION: Left  frontal edema, probably vasogenic and secondary to an underlying metastasis given history. Regional mass effect is present with mild rightward midline shift. Contrast enhanced MRI is recommended. These results were called by telephone at the time of interpretation on 10/08/2021 at 5:47 pm to provider [facility] , who verbally acknowledged these results. Electronically Signed   By: Macy Mis M.D.   On: 10/08/2021 17:47   CT Cervical Spine Wo Contrast  Result Date: 10/08/2021 CLINICAL DATA:  Neck trauma, dangerous injury mechanism (Age 45-64y) EXAM: CT CERVICAL SPINE WITHOUT CONTRAST TECHNIQUE: Multidetector CT imaging of the cervical spine was performed without intravenous contrast. Multiplanar CT image reconstructions were also generated. RADIATION DOSE REDUCTION: This exam was performed according to the departmental dose-optimization program which includes automated exposure control, adjustment of the mA and/or kV according to patient size and/or use of iterative reconstruction technique. COMPARISON:  None. FINDINGS: Alignment: Preserved. Skull base and vertebrae: Vertebral  body heights are maintained. No acute fracture. No destructive osseous lesion. Soft tissues and spinal canal: No prevertebral fluid or swelling. No visible canal hematoma. Disc levels:  Mild degenerative changes at C4-C5. Upper chest: No apical lung mass. Other: Partially imaged right chest wall port catheter. IMPRESSION: No acute cervical spine fracture. Electronically Signed   By: Macy Mis M.D.   On: 10/08/2021 17:49   MR BRAIN W WO CONTRAST  Result Date: 10/08/2021 CLINICAL DATA:  Mental status change, history of breast cancer, seizure, possible metastasis on head CT EXAM: MRI HEAD WITHOUT AND WITH CONTRAST TECHNIQUE: Multiplanar, multiecho pulse sequences of the brain and surrounding structures were obtained without and with intravenous contrast. CONTRAST:  7.10mL GADAVIST GADOBUTROL 1 MMOL/ML IV SOLN COMPARISON:   10/08/2021 FINDINGS: Brain: Heterogeneously enhancing mass in the left frontal lobe, which measures 2.8 x 2.1 x 2.0 cm (AP x TR x CC) (series 23, image 37 and series 24, image 20). Significant surrounding T2 hyperintense signal, most likely edema, which causes local mass effect with effacement of the left-greater-than-right lateral ventricle and approximately 5 mm of left-to-right midline shift. No restricted diffusion to suggest acute infarct. No acute hemorrhage. No hydrocephalus or extra-axial collection. The hippocampi are symmetric in size and normal in signal. No evidence of gray matter heterotopia or underlying cortical dysplasia. Vascular: Normal flow voids. The ACA flow voids are deviated to the right by the edema surrounding the mass, but appear patent. Skull and upper cervical spine: Normal marrow signal. Sinuses/Orbits: Negative. Other: The mastoids are well aerated. IMPRESSION: 1. 2.8 cm left frontal lobe mass, favored to represent a metastatic lesion, given the patient's history of breast cancer and degree of surrounding edema, versus a primary CNS neoplasm. 2. Edema surrounding left frontal lobe mass with local mass effect, effacement of the left-greater-than-right lateral ventricles, and 5 mm of left-to-right midline shift. 3. No other seizure etiology identified. Electronically Signed   By: Merilyn Baba M.D.   On: 10/08/2021 21:32    EKG: I have personally reviewed EKG: NSR   Assessment/Plan Principal Problem:   Frontal mass of brain Active Problems:   Malignant neoplasm of overlapping sites of left breast in female, estrogen receptor positive ([place])    Malignant neoplasm of overlapping sites of left breast in female, estrogen receptor positive ([place]) Followed at [facility]. Completed lumpectomy, chemo and XRT.  Frontal mass of brain Admit to progressive bed.  Inpatient status.  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  Neurology and neurosurgery of both been  consulted.  Patient given 10 mg of IV Decadron and loaded with Keppra.  We will continue Decadron at 8 mg twice daily.  Defer to neurology if the patient needs to continue Keppra.  Discussed with the family at bedside.  Patient works in [place] but lives in [place].  Family also lives in [place].  Her base hospital [facility] in [place].  Discussed with the family that at this point, transfer to [facility] would not be advisable.  After neurosurgery and neurology have had a chance to evaluate her and come up with a plan for treatment, then would be an appropriate time to inquire about transfer to another hospital.  Patient will likely need oncology input as well.  Verified with the patient's sister that the patient voiced she wanted to be a DNR/DNI.  DVT prophylaxis: SCDs Code Status: DNR/DNI(Do NOT Intubate) discussed with pt's sister Shannon Hayes Family Communication: discussed at bedside with pt's sister Shannon Hayes and nephew Shannon Hayes  Disposition Plan: unknown  Consults called: neurology and neurosurgery  Admission status: Inpatient,  progressive   Kristopher Oppenheim, DO [facility] 10/08/2021, 9:59 PM

## 2021-10-08 NOTE — Assessment & Plan Note (Signed)
Admit to progressive bed.  Inpatient status.  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  Neurology and neurosurgery of both been consulted.  Patient given 10 mg of IV Decadron and loaded with Keppra.  We will continue Decadron at 8 mg twice daily.  Defer to neurology if the patient needs to continue Keppra.  Discussed with the family at bedside.  Patient works in [place] but lives in [place].  Family also lives in [place].  Her base hospital [facility] in [place].  Discussed with the family that at this point, transfer to [facility] would not be advisable.  After neurosurgery and neurology have had a chance to evaluate her and come up with a plan for treatment, then would be an appropriate time to inquire about transfer to another hospital.  Patient will likely need oncology input as well.  Verified with the patient's sister that the patient voiced she wanted to be a DNR/DNI.

## 2021-10-08 NOTE — Assessment & Plan Note (Signed)
Followed at [facility]. Completed lumpectomy, chemo and XRT.

## 2021-10-08 NOTE — ED Notes (Signed)
Patient is minimally responsive on arrival. Begins to wake up and give yes no answers. After a few minutes patient's right arm begins to bounce up and down repetitively. Patient also begins to moan repetitively. Airway and breathing intact. Vitals remain WNL. Provider Horton DO notified and IV ativan 2mg  ordered stat. Given and motions cease within 2 minutes.

## 2021-10-08 NOTE — ED Provider Notes (Addendum)
[facility] EMERGENCY DEPARTMENT Provider Note   CSN: [id number] Arrival date & time: 10/08/21  1551     History  Chief Complaint  Patient presents with   Seizures    Shannon Hayes is a 51 y.o. female.  HPI  51 year old female with past medical history of breast cancer status postlumpectomy and active chemo/radiation presents emergency department with seizure-like activity.  Bystanders witnessed the patient have a seizure in the chair lasting about 2 minutes, she was slowly lowered to the ground, did not hit her head, was minimally responsive on EMS arrival.  Maintaining her airway.  On arrival patient has focal twitching of the right upper extremity and right side of the face.  History limited secondary to active seizure and acuity.  No family at bedside.  Home Medications Prior to Admission medications   Not on File      Allergies    Patient has no allergy information on record.    Review of Systems   Review of Systems  Unable to perform ROS: Acuity of condition   Physical Exam Updated Vital Signs BP 109/67    Pulse 90    Temp 97.8 F (36.6 C) (Oral)    Resp 16    Ht 5\' 6"  (1.676 m)    Wt 72.6 kg    SpO2 98%    BMI 25.82 kg/m  Physical Exam Vitals and nursing note reviewed.  Constitutional:      Appearance: She is not diaphoretic.  HENT:     Head: Normocephalic.     Mouth/Throat:     Mouth: Mucous membranes are moist.  Eyes:     Pupils: Pupils are equal, round, and reactive to light.  Cardiovascular:     Rate and Rhythm: Normal rate.  Pulmonary:     Effort: Pulmonary effort is normal. No respiratory distress.  Skin:    General: Skin is warm.  Neurological:     Mental Status: She is alert. She is disoriented.     Comments: Active right upper extremity and right-sided facial twitching    ED Results / Procedures / Treatments   Labs (all labs ordered are listed, but only abnormal results are displayed) Labs Reviewed  CBC WITH  DIFFERENTIAL/PLATELET - Abnormal; Notable for the following components:      Result Value   Lymphs Abs 0.6 (*)    All other components within normal limits  COMPREHENSIVE METABOLIC PANEL - Abnormal; Notable for the following components:   CO2 18 (*)    Glucose, Bld 128 (*)    AST 44 (*)    Total Bilirubin 1.5 (*)    All other components within normal limits  CBG MONITORING, ED - Abnormal; Notable for the following components:   Glucose-Capillary 133 (*)    All other components within normal limits  I-STAT CHEM 8, ED - Abnormal; Notable for the following components:   Glucose, Bld 136 (*)    Calcium, Ion 1.13 (*)    All other components within normal limits  RESP PANEL BY RT-PCR (FLU A&B, COVID) ARPGX2  ETHANOL  URINALYSIS, ROUTINE W REFLEX MICROSCOPIC  RAPID URINE DRUG SCREEN, HOSP PERFORMED  I-STAT BETA HCG BLOOD, ED ([facility], [facility], [facility] ONLY)    EKG None  Radiology CT Head Wo Contrast  Result Date: 10/08/2021 CLINICAL DATA:  Mental status change, unknown cause possible seizure, h/o breast cancer EXAM: CT HEAD WITHOUT CONTRAST TECHNIQUE: Contiguous axial images were obtained from the base of the skull through the vertex without  intravenous contrast. RADIATION DOSE REDUCTION: This exam was performed according to the departmental dose-optimization program which includes automated exposure control, adjustment of the mA and/or kV according to patient size and/or use of iterative reconstruction technique. COMPARISON:  None. FINDINGS: Brain: There is significant edema in the left frontal lobe. There could be some loss of gray differentiation is region. Effacement of adjacent ventricles with rightward midline shift measuring 4 mm. No hydrocephalus. Remainder of brain is unremarkable. No acute intracranial hemorrhage. Vascular: There is mild atherosclerotic calcification at the skull base. Skull: Calvarium is unremarkable. Sinuses/Orbits: No acute finding. Other: None. IMPRESSION: Left frontal edema,  probably vasogenic and secondary to an underlying metastasis given history. Regional mass effect is present with mild rightward midline shift. Contrast enhanced MRI is recommended. These results were called by telephone at the time of interpretation on 10/08/2021 at 5:47 pm to provider [facility] , who verbally acknowledged these results. Electronically Signed   By: Macy Mis M.D.   On: 10/08/2021 17:47   CT Cervical Spine Wo Contrast  Result Date: 10/08/2021 CLINICAL DATA:  Neck trauma, dangerous injury mechanism (Age 49-64y) EXAM: CT CERVICAL SPINE WITHOUT CONTRAST TECHNIQUE: Multidetector CT imaging of the cervical spine was performed without intravenous contrast. Multiplanar CT image reconstructions were also generated. RADIATION DOSE REDUCTION: This exam was performed according to the departmental dose-optimization program which includes automated exposure control, adjustment of the mA and/or kV according to patient size and/or use of iterative reconstruction technique. COMPARISON:  None. FINDINGS: Alignment: Preserved. Skull base and vertebrae: Vertebral body heights are maintained. No acute fracture. No destructive osseous lesion. Soft tissues and spinal canal: No prevertebral fluid or swelling. No visible canal hematoma. Disc levels:  Mild degenerative changes at C4-C5. Upper chest: No apical lung mass. Other: Partially imaged right chest wall port catheter. IMPRESSION: No acute cervical spine fracture. Electronically Signed   By: Macy Mis M.D.   On: 10/08/2021 17:49    Procedures .Critical Care Performed by: Lorelle Gibbs, DO Authorized by: Lorelle Gibbs, DO   Critical care provider statement:    Critical care time (minutes):  60   Critical care time was exclusive of:  Separately billable procedures and treating other patients   Critical care was necessary to treat or prevent imminent or life-threatening deterioration of the following conditions:  CNS failure or  compromise   Critical care was time spent personally by me on the following activities:  Development of treatment plan with patient or surrogate, discussions with consultants, evaluation of patient's response to treatment, examination of patient, ordering and review of laboratory studies, ordering and review of radiographic studies, ordering and performing treatments and interventions, pulse oximetry, re-evaluation of patient's condition and review of old charts   I assumed direction of critical care for this patient from another provider in my specialty: no     Care discussed with: admitting provider      Medications Ordered in ED Medications  LORazepam (ATIVAN) injection 2 mg (2 mg Intravenous Given 10/08/21 1625)  LORazepam (ATIVAN) injection 1 mg (1 mg Intravenous Given 10/08/21 1849)  levETIRAcetam (KEPPRA) IVPB 1000 mg/100 mL premix (0 mg Intravenous Stopped 10/08/21 1939)  dexamethasone (DECADRON) injection 10 mg (10 mg Intravenous Given 10/08/21 1851)    ED Course/ Medical Decision Making/ A&P                           Medical Decision Making Amount and/or Complexity of Data Reviewed Labs:  ordered. Radiology: ordered.  Risk Prescription drug management.   This patient presents to the ED for concern of new onset seizure, this involves an extensive number of treatment options, and is a complaint that carries with it a high risk of complications and morbidity.  The differential diagnosis includes seizure-like activity, brain metastatic disease, syncope   Additional history obtained: -Additional history obtained from EMS, sister at bedside -External records from outside source obtained and reviewed including: Chart review including previous notes, labs, imaging, consultation notes   Lab Tests: -I ordered, reviewed, and interpreted labs.  The pertinent results include:  baseline labs   EKG -NSR   Imaging Studies ordered: -I ordered imaging studies including head CT   -I  independently visualized and interpreted imaging which showed left frontal edema with midline shift -I agree with the radiologist interpretation   Medicines ordered and prescription drug management: -I ordered medication including Ativan for seizure activity -Reevaluation of the patient after these medicines showed that the patient improved -I have reviewed the patients home medicines and have made adjustments as needed   Consultations Obtained: I requested consultation with the neurology Dr. Justine Null and neurosurgery,  and discussed lab and imaging findings as well as pertinent plan - they recommend: Neurology recommends loading with Keppra, Decadron and obtaining an MRI.  Neurosurgery is aware, patient will be admitted medically and they will follow.   ED Course: 51 year old female presents emergency department with new onset seizure activity.  History limited secondary to acuity and seizure activity.  History of breast cancer with active chemo/radiation.  No known metastatic disease.  She had focal twitching/seizure on arrival, given Ativan is slightly sedated from that.  Head CT shows left frontal edema concerning for metastatic disease with mild midline shift.  Neurology was consulted with active seizure-like activity.  They recommended loading with Keppra and Decadron.  We are obtaining an MRI of the brain and will make neurosurgery aware.  Plan for medical admission.  Sister at bedside is updated.   Critical Interventions: IV antiepileptics, neurology and neurosurgery consultation   Cardiac Monitoring: The patient was maintained on a cardiac monitor.  I personally viewed and interpreted the cardiac monitored which showed an underlying rhythm of: NSR   Reevaluation: After the interventions noted above, I reevaluated the patient and found that they have :improved   Dispostion: Patients evaluation and results requires admission for further treatment and care.  Spoke with hospitalist,  reviewed patient's ED course and they accept admission.  Patient agrees with admission plan, offers no new complaints and is stable/unchanged at time of admit.        Final Clinical Impression(s) / ED Diagnoses Final diagnoses:  None    Rx / DC Orders ED Discharge Orders     None         Lorelle Gibbs, DO 10/08/21 2058    Lorelle Gibbs, DO 10/08/21 2058

## 2021-10-08 NOTE — Consult Note (Addendum)
Reason for Consult: Left frontal tumor Referring Physician: Emergency department  Shannon Hayes is an 51 y.o. female.  HPI: 51 year old female with history of breast cancer without known metastatic disease.  Patient presented today following a generalized seizure.  Patient evaluated in the ER with symptoms of ongoing simple partial status.  Seizure broken with the diazepam's and Keppra.  Currently patient postictal.  She is somewhat somnolent but will awaken and answer questions.  Denies headache.  Denies prior seizures.  No past medical history on file.    No family history on file.  Social History:  has no history on file for tobacco use, alcohol use, and drug use.  Allergies: No Known Allergies  Medications: I have reviewed the patient's current medications.  Results for orders placed or performed during the hospital encounter of 10/08/21 (from the past 48 hour(s))  Resp Panel by RT-PCR (Flu A&B, Covid) Nasopharyngeal Swab     Status: None   Collection Time: 10/08/21  3:51 PM   Specimen: Nasopharyngeal Swab; Nasopharyngeal(NP) swabs in vial transport medium  Result Value Ref Range   SARS Coronavirus 2 by RT PCR NEGATIVE NEGATIVE    Comment: (NOTE) SARS-CoV-2 target nucleic acids are NOT DETECTED.  The SARS-CoV-2 RNA is generally detectable in upper respiratory specimens during the acute phase of infection. The lowest concentration of SARS-CoV-2 viral copies this assay can detect is 138 copies/mL. A negative result does not preclude SARS-Cov-2 infection and should not be used as the sole basis for treatment or other patient management decisions. A negative result may occur with  improper specimen collection/handling, submission of specimen other than nasopharyngeal swab, presence of viral mutation(s) within the areas targeted by this assay, and inadequate number of viral copies(<138 copies/mL). A negative result must be combined with clinical observations, patient history,  and epidemiological information. The expected result is Negative.  Fact Sheet for Patients:  EntrepreneurPulse.com.au  Fact Sheet for Healthcare Providers:  IncredibleEmployment.be  This test is no t yet approved or cleared by the [place] FDA and  has been authorized for detection and/or diagnosis of SARS-CoV-2 by FDA under an Emergency Use Authorization (EUA). This EUA will remain  in effect (meaning this test can be used) for the duration of the COVID-19 declaration under Section 564(b)(1) of the Act, 21 U.S.C.section 360bbb-3(b)(1), unless the authorization is terminated  or revoked sooner.       Influenza A by PCR NEGATIVE NEGATIVE   Influenza B by PCR NEGATIVE NEGATIVE    Comment: (NOTE) The Xpert Xpress SARS-CoV-2/FLU/RSV plus assay is intended as an aid in the diagnosis of influenza from Nasopharyngeal swab specimens and should not be used as a sole basis for treatment. Nasal washings and aspirates are unacceptable for Xpert Xpress SARS-CoV-2/FLU/RSV testing.  Fact Sheet for Patients: EntrepreneurPulse.com.au  Fact Sheet for Healthcare Providers: IncredibleEmployment.be  This test is not yet approved or cleared by the [place] FDA and has been authorized for detection and/or diagnosis of SARS-CoV-2 by FDA under an Emergency Use Authorization (EUA). This EUA will remain in effect (meaning this test can be used) for the duration of the COVID-19 declaration under Section 564(b)(1) of the Act, 21 U.S.C. section 360bbb-3(b)(1), unless the authorization is terminated or revoked.  Performed at [facility], [place] [address] [place], [place] [address]   CBG monitoring, ED     Status: Abnormal   Collection Time: 10/08/21  4:07 PM  Result Value Ref Range   Glucose-Capillary 133 (H) 70 - 99 mg/dL  Comment: Glucose reference range applies only to samples taken after fasting for at least  8 hours.  Ethanol     Status: None   Collection Time: 10/08/21  4:24 PM  Result Value Ref Range   Alcohol, Ethyl (B) <10 <10 mg/dL    Comment: (NOTE) Lowest detectable limit for serum alcohol is 10 mg/dL.  For medical purposes only. Performed at [facility], [place] [address], [place], [place] [address]   CBC with Differential     Status: Abnormal   Collection Time: 10/08/21  4:27 PM  Result Value Ref Range   WBC 5.0 4.0 - 10.5 K/uL   RBC 4.07 3.87 - 5.11 MIL/uL   Hemoglobin 12.9 12.0 - 15.0 g/dL   HCT 38.8 36.0 - 46.0 %   MCV 95.3 80.0 - 100.0 fL   MCH 31.7 26.0 - 34.0 pg   MCHC 33.2 30.0 - 36.0 g/dL   RDW 12.9 11.5 - 15.5 %   Platelets 197 150 - 400 K/uL   nRBC 0.0 0.0 - 0.2 %   Neutrophils Relative % 80 %   Neutro Abs 4.0 1.7 - 7.7 K/uL   Lymphocytes Relative 13 %   Lymphs Abs 0.6 (L) 0.7 - 4.0 K/uL   Monocytes Relative 6 %   Monocytes Absolute 0.3 0.1 - 1.0 K/uL   Eosinophils Relative 1 %   Eosinophils Absolute 0.0 0.0 - 0.5 K/uL   Basophils Relative 0 %   Basophils Absolute 0.0 0.0 - 0.1 K/uL   Immature Granulocytes 0 %   Abs Immature Granulocytes 0.02 0.00 - 0.07 K/uL    Comment: Performed at [facility] [address], [place], [place] [address]  Comprehensive metabolic panel     Status: Abnormal   Collection Time: 10/08/21  4:27 PM  Result Value Ref Range   Sodium 137 135 - 145 mmol/L   Potassium 4.6 3.5 - 5.1 mmol/L    Comment: HEMOLYSIS AT THIS LEVEL MAY AFFECT RESULT   Chloride 104 98 - 111 mmol/L   CO2 18 (L) 22 - 32 mmol/L   Glucose, Bld 128 (H) 70 - 99 mg/dL    Comment: Glucose reference range applies only to samples taken after fasting for at least 8 hours.   BUN 12 6 - 20 mg/dL   Creatinine, Ser 0.90 0.44 - 1.00 mg/dL   Calcium 9.3 8.9 - 10.3 mg/dL   Total Protein 6.8 6.5 - 8.1 g/dL   Albumin 3.7 3.5 - 5.0 g/dL   AST 44 (H) 15 - 41 U/L   ALT 10 0 - 44 U/L   Alkaline Phosphatase 54 38 - 126 U/L   Total Bilirubin 1.5 (H) 0.3 - 1.2  mg/dL   GFR, Estimated >60 >60 mL/min    Comment: (NOTE) Calculated using the CKD-EPI Creatinine Equation (2021)    Anion gap 15 5 - 15    Comment: Performed at [facility] [address], [place], [place] [address]  I-stat chem 8, ED     Status: Abnormal   Collection Time: 10/08/21  4:34 PM  Result Value Ref Range   Sodium 138 135 - 145 mmol/L   Potassium 4.3 3.5 - 5.1 mmol/L   Chloride 106 98 - 111 mmol/L   BUN 15 6 - 20 mg/dL   Creatinine, Ser 0.80 0.44 - 1.00 mg/dL   Glucose, Bld 136 (H) 70 - 99 mg/dL    Comment: Glucose reference range applies only to samples taken after  fasting for at least 8 hours.   Calcium, Ion 1.13 (L) 1.15 - 1.40 mmol/L   TCO2 23 22 - 32 mmol/L   Hemoglobin 12.9 12.0 - 15.0 g/dL   HCT 38.0 36.0 - 46.0 %  I-Stat beta hCG blood, ED     Status: None   Collection Time: 10/08/21  7:01 PM  Result Value Ref Range   I-stat hCG, quantitative <5.0 <5 mIU/mL   Comment 3            Comment:   GEST. AGE      CONC.  (mIU/mL)   <=1 WEEK        5 - 50     2 WEEKS       50 - 500     3 WEEKS       100 - 10,000     4 WEEKS     1,000 - 30,000        FEMALE AND NON-PREGNANT FEMALE:     LESS THAN 5 mIU/mL     CT Head Wo Contrast  Result Date: 10/08/2021 CLINICAL DATA:  Mental status change, unknown cause possible seizure, h/o breast cancer EXAM: CT HEAD WITHOUT CONTRAST TECHNIQUE: Contiguous axial images were obtained from the base of the skull through the vertex without intravenous contrast. RADIATION DOSE REDUCTION: This exam was performed according to the departmental dose-optimization program which includes automated exposure control, adjustment of the mA and/or kV according to patient size and/or use of iterative reconstruction technique. COMPARISON:  None. FINDINGS: Brain: There is significant edema in the left frontal lobe. There could be some loss of gray differentiation is region. Effacement of adjacent ventricles with rightward midline shift measuring 4  mm. No hydrocephalus. Remainder of brain is unremarkable. No acute intracranial hemorrhage. Vascular: There is mild atherosclerotic calcification at the skull base. Skull: Calvarium is unremarkable. Sinuses/Orbits: No acute finding. Other: None. IMPRESSION: Left frontal edema, probably vasogenic and secondary to an underlying metastasis given history. Regional mass effect is present with mild rightward midline shift. Contrast enhanced MRI is recommended. These results were called by telephone at the time of interpretation on 10/08/2021 at 5:47 pm to provider [facility] , who verbally acknowledged these results. Electronically Signed   By: Macy Mis M.D.   On: 10/08/2021 17:47   CT Cervical Spine Wo Contrast  Result Date: 10/08/2021 CLINICAL DATA:  Neck trauma, dangerous injury mechanism (Age 89-64y) EXAM: CT CERVICAL SPINE WITHOUT CONTRAST TECHNIQUE: Multidetector CT imaging of the cervical spine was performed without intravenous contrast. Multiplanar CT image reconstructions were also generated. RADIATION DOSE REDUCTION: This exam was performed according to the departmental dose-optimization program which includes automated exposure control, adjustment of the mA and/or kV according to patient size and/or use of iterative reconstruction technique. COMPARISON:  None. FINDINGS: Alignment: Preserved. Skull base and vertebrae: Vertebral body heights are maintained. No acute fracture. No destructive osseous lesion. Soft tissues and spinal canal: No prevertebral fluid or swelling. No visible canal hematoma. Disc levels:  Mild degenerative changes at C4-C5. Upper chest: No apical lung mass. Other: Partially imaged right chest wall port catheter. IMPRESSION: No acute cervical spine fracture. Electronically Signed   By: Macy Mis M.D.   On: 10/08/2021 17:49   MR BRAIN W WO CONTRAST  Result Date: 10/08/2021 CLINICAL DATA:  Mental status change, history of breast cancer, seizure, possible metastasis on  head CT EXAM: MRI HEAD WITHOUT AND WITH CONTRAST TECHNIQUE: Multiplanar, multiecho pulse sequences of the brain and surrounding structures  were obtained without and with intravenous contrast. CONTRAST:  7.52mL GADAVIST GADOBUTROL 1 MMOL/ML IV SOLN COMPARISON:  10/08/2021 FINDINGS: Brain: Heterogeneously enhancing mass in the left frontal lobe, which measures 2.8 x 2.1 x 2.0 cm (AP x TR x CC) (series 23, image 37 and series 24, image 20). Significant surrounding T2 hyperintense signal, most likely edema, which causes local mass effect with effacement of the left-greater-than-right lateral ventricle and approximately 5 mm of left-to-right midline shift. No restricted diffusion to suggest acute infarct. No acute hemorrhage. No hydrocephalus or extra-axial collection. The hippocampi are symmetric in size and normal in signal. No evidence of gray matter heterotopia or underlying cortical dysplasia. Vascular: Normal flow voids. The ACA flow voids are deviated to the right by the edema surrounding the mass, but appear patent. Skull and upper cervical spine: Normal marrow signal. Sinuses/Orbits: Negative. Other: The mastoids are well aerated. IMPRESSION: 1. 2.8 cm left frontal lobe mass, favored to represent a metastatic lesion, given the patient's history of breast cancer and degree of surrounding edema, versus a primary CNS neoplasm. 2. Edema surrounding left frontal lobe mass with local mass effect, effacement of the left-greater-than-right lateral ventricles, and 5 mm of left-to-right midline shift. 3. No other seizure etiology identified. Electronically Signed   By: Merilyn Baba M.D.   On: 10/08/2021 21:32    Review of systems not obtained due to patient factors. Blood pressure 109/67, pulse 85, temperature 97.8 F (36.6 C), temperature source Oral, resp. rate 19, height 5\' 6"  (1.676 m), weight 72.6 kg, SpO2 100 %. Patient is somnolent but will awaken.  She is oriented to person place and time.  Her speech is  fluent.  Judgment and insight are difficult to assess secondary to level of consciousness.  Cranial nerve function appears intact bilaterally.  Motor examination with some moderate right upper and lower extremity weakness.  Sensory examination nonfocal.  Examination head ears eyes nose throat is unremarked.  Chest and abdomen benign.  Extremities free from injury or deformity.  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  Agree with hospital admission with treatment with IV Decadron and Keppra.  I will discuss possible surgical resection with the patient in the morning.  Cooper Render Skylor Schnapp 10/08/2021, 9:48 PM

## 2021-10-08 NOTE — ED Notes (Signed)
Patient responds to voice and will open eyes if told. Not giving clear answers. Just yes and no responses. No longer experiencing arm jerking motions. Calm. Resting with vitals WNL. Family (sister) at bedside.

## 2021-10-08 NOTE — ED Notes (Signed)
Out to MRI 

## 2021-10-08 NOTE — Consult Note (Signed)
Neurology Consultation Reason for Consult: seizure Referring Physician: Horton, K  CC: Seizure  History is obtained from:patient  HPI: Shannon Hayes is a 51 y.o. female with a history of breast cancer who presents with seizure.  She was at work earlier today when she had a witnessed seizure.  She was brought into the emergency department where she was seen to have focal right upper extremity twitching.  She was given Ativan and Keppra and her seizure stopped.  CT revealed frontal tumor, and an MRI confirmed this an isolated tumor.  She has since woken up, though she is still lethargic she is able to be aroused.  ROS: A 14 point ROS was performed and is negative except as noted in the HPI.    Social History: Occasional alcohol   Exam: Current vital signs: BP 126/81    Pulse 74    Temp 97.8 F (36.6 C) (Oral)    Resp 12    Ht 5\' 6"  (1.676 m)    Wt 72.6 kg    SpO2 100%    BMI 25.82 kg/m  Vital signs in last 24 hours: Temp:  [97.8 F (36.6 C)] 97.8 F (36.6 C) (01/24 1630) Pulse Rate:  [74-89] 74 (01/24 1800) Resp:  [12-22] 12 (01/24 1800) BP: (114-126)/(76-102) 126/81 (01/24 1800) SpO2:  [98 %-100 %] 100 % (01/24 1800) Weight:  [72.6 kg] 72.6 kg (01/24 1559)   Physical Exam  Constitutional: Appears well-developed and well-nourished.  Psych: Affect appropriate to situation Eyes: No scleral injection HENT: No OP obstruction MSK: no joint deformities.  Cardiovascular: Normal rate and regular rhythm.  Respiratory: Effort normal, non-labored breathing GI: Soft.  No distension. There is no tenderness.  Skin: WDI  Neuro: Mental Status: Patient is lethargic but arousable, oriented to person, place, month, year, and situation. Patient is able to give a clear and coherent history. No signs of aphasia or neglect Cranial Nerves: II: Visual Fields are full. Pupils are equal, round, and reactive to light.   III,IV, VI: EOMI without ptosis or diploplia.  V: Facial sensation is  symmetric to temperature VII: Facial movement is symmetric.  VIII: hearing is intact to voice X: Uvula elevates symmetrically XI: Shoulder shrug is symmetric. XII: tongue is midline without atrophy or fasciculations.  Motor: Tone is normal. Bulk is normal. 5/5 strength was present in all four extremities.  Sensory: Sensation is symmetric to light touch and temperature in the arms and legs. Deep Tendon Reflexes: 2+ and symmetric in the biceps and patellae.  Plantars: Toes are downgoing bilaterally.  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  Recommendations: 1) continue Keppra at 1 g twice daily 2) agree with Decadron 8 mg every 12h 3) neurosurgery to guide surgical treatment decisions.   Roland Rack, MD [facility] [phone]  If 7pm- 7am, please page neurology on call as listed in [place].

## 2021-10-08 NOTE — ED Triage Notes (Signed)
BIB [facility] from work at [employer]. Witnessed seizure in chair lasting about 2 minutes. Slowly fell to floor, did not his head. Minimally responsive, maintains airway, vitals WNL. Only HX available is breast cancer- reported by sister on phone with EMS.  128/80 72HR 16RR 98% RA 151 CBG  18ga Left AC

## 2021-10-08 NOTE — Subjective & Objective (Signed)
CC: seizure HPI: [name] with hx of breast cancer s/p lumpectomy, XRT, chemo followed by Dr. Marylou Mccoy with [facility] heme/onc, presents to the ER today from her office at [facility] [employer].  Patient reportedly had a seizure and fell to the ground.  Patient was brought in unconscious.  Patient lives in [place] but works in [place].  Her base hospital is [facility] in [place].  Laboratory evaluation here in the ER showed bicarbonate of 18, glucose of 128 otherwise unremarkable.  CT head showed large frontal edema.  With regional mass-effect and right midline shift.  Brain MRI demonstrated 2.8 cm left frontal lobe mass.  There is surrounding vasogenic edema.  There is effacement of the ventricle.  With a midline shift.  Neurology and neurosurgery both consulted.  Patient loaded with IV Ativan and Keppra.  [facility] contacted for admission.

## 2021-10-09 ENCOUNTER — Inpatient Hospital Stay (HOSPITAL_COMMUNITY): Payer: 59

## 2021-10-09 ENCOUNTER — Other Ambulatory Visit: Payer: Self-pay | Admitting: Neurosurgery

## 2021-10-09 DIAGNOSIS — R569 Unspecified convulsions: Secondary | ICD-10-CM

## 2021-10-09 DIAGNOSIS — G9389 Other specified disorders of brain: Secondary | ICD-10-CM | POA: Diagnosis not present

## 2021-10-09 LAB — COMPREHENSIVE METABOLIC PANEL
ALT: 11 U/L (ref 0–44)
AST: 22 U/L (ref 15–41)
Albumin: 3.6 g/dL (ref 3.5–5.0)
Alkaline Phosphatase: 65 U/L (ref 38–126)
Anion gap: 7 (ref 5–15)
BUN: 9 mg/dL (ref 6–20)
CO2: 21 mmol/L — ABNORMAL LOW (ref 22–32)
Calcium: 9.7 mg/dL (ref 8.9–10.3)
Chloride: 107 mmol/L (ref 98–111)
Creatinine, Ser: 0.83 mg/dL (ref 0.44–1.00)
GFR, Estimated: >60 mL/min (ref >60)
Glucose, Bld: 140 mg/dL — ABNORMAL HIGH (ref 70–99)
Potassium: 3.9 mmol/L (ref 3.5–5.1)
Sodium: 135 mmol/L (ref 135–145)
Total Bilirubin: 0.5 mg/dL (ref 0.3–1.2)
Total Protein: 6.9 g/dL (ref 6.5–8.1)

## 2021-10-09 LAB — GLUCOSE, CAPILLARY
Glucose-Capillary: 126 mg/dL — ABNORMAL HIGH (ref 70–99)
Glucose-Capillary: 131 mg/dL — ABNORMAL HIGH (ref 70–99)
Glucose-Capillary: 146 mg/dL — ABNORMAL HIGH (ref 70–99)

## 2021-10-09 LAB — CBC WITH DIFFERENTIAL/PLATELET
Abs Immature Granulocytes: 0.01 10*3/uL (ref 0.00–0.07)
Basophils Absolute: 0 10*3/uL (ref 0.0–0.1)
Basophils Relative: 0 %
Eosinophils Absolute: 0 10*3/uL (ref 0.0–0.5)
Eosinophils Relative: 0 %
HCT: 37.8 % (ref 36.0–46.0)
Hemoglobin: 12.7 g/dL (ref 12.0–15.0)
Immature Granulocytes: 0 %
Lymphocytes Relative: 7 %
Lymphs Abs: 0.4 10*3/uL — ABNORMAL LOW (ref 0.7–4.0)
MCH: 31.2 pg (ref 26.0–34.0)
MCHC: 33.6 g/dL (ref 30.0–36.0)
MCV: 92.9 fL (ref 80.0–100.0)
Monocytes Absolute: 0.1 10*3/uL (ref 0.1–1.0)
Monocytes Relative: 1 %
Neutro Abs: 4.9 10*3/uL (ref 1.7–7.7)
Neutrophils Relative %: 92 %
Platelets: 203 10*3/uL (ref 150–400)
RBC: 4.07 MIL/uL (ref 3.87–5.11)
RDW: 12.5 % (ref 11.5–15.5)
WBC: 5.3 10*3/uL (ref 4.0–10.5)
nRBC: 0 % (ref 0.0–0.2)

## 2021-10-09 LAB — CBG MONITORING, ED: Glucose-Capillary: 150 mg/dL — ABNORMAL HIGH (ref 70–99)

## 2021-10-09 LAB — HIV ANTIBODY (ROUTINE TESTING W REFLEX): HIV Screen 4th Generation wRfx: NONREACTIVE

## 2021-10-09 LAB — URINALYSIS, ROUTINE W REFLEX MICROSCOPIC
Bilirubin Urine: NEGATIVE
Glucose, UA: NEGATIVE mg/dL
Hgb urine dipstick: NEGATIVE
Ketones, ur: NEGATIVE mg/dL
Leukocytes,Ua: NEGATIVE
Nitrite: NEGATIVE
Protein, ur: NEGATIVE mg/dL
Specific Gravity, Urine: 1.017 (ref 1.005–1.030)
pH: 7 (ref 5.0–8.0)

## 2021-10-09 LAB — RAPID URINE DRUG SCREEN, HOSP PERFORMED
Amphetamines: NOT DETECTED
Barbiturates: NOT DETECTED
Benzodiazepines: POSITIVE — AB
Cocaine: NOT DETECTED
Opiates: NOT DETECTED
Tetrahydrocannabinol: NOT DETECTED

## 2021-10-09 LAB — MAGNESIUM: Magnesium: 2 mg/dL (ref 1.7–2.4)

## 2021-10-09 IMAGING — MR MR HEAD WO/W CM
15 of 16 series · 32 of 48 positions shown · IV contrast (Y GAD)
Comparison: [DATE]

CLINICAL DATA: Brain mass

EXAM:
MRI HEAD WITHOUT AND WITH CONTRAST
TECHNIQUE: Multiplanar, multiecho pulse sequences of the brain and surrounding
structures were obtained without and with intravenous contrast.
CONTRAST:  8.5mL GADAVIST GADOBUTROL 1 MMOL/ML IV SOLN

[Series 2: FLAIR · sagittal · 3.0mm · 0.47mm/px · 1 of 43 slices shown (1 of 2)]
[im 1/43]
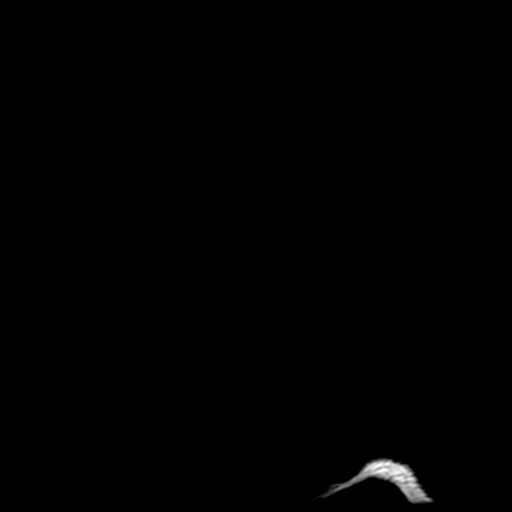

[Series 3: T2 · axial · 5.0mm · 0.43mm/px · 1 of 29 slices shown]
[im 1/29]
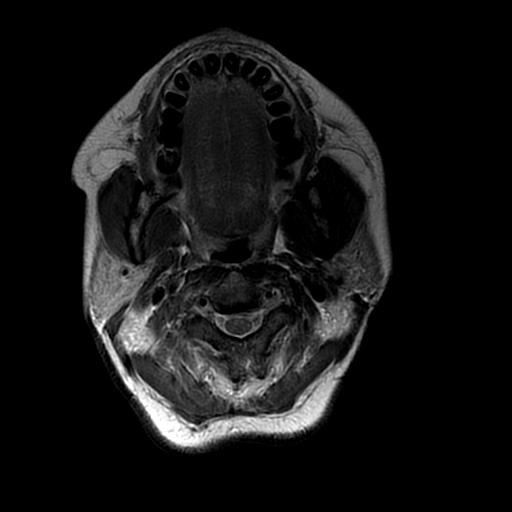

[Series 4: ax dti · axial · 3.0mm · 0.94mm/px · z∈[-47,+136]mm · 9 of 1690 slices shown]
[im 1/1690]
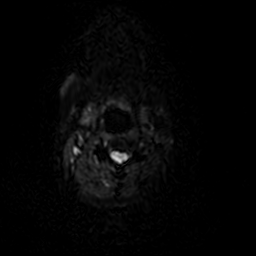
[im 85/1690]
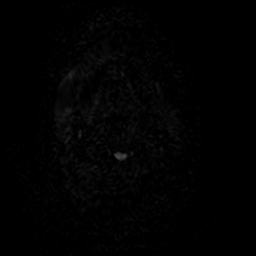
[im 254/1690]
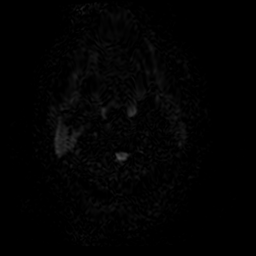
[im 507/1690]
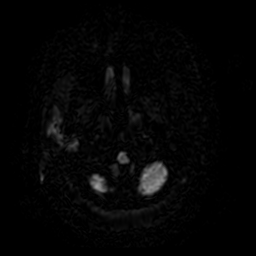
[im 761/1690]
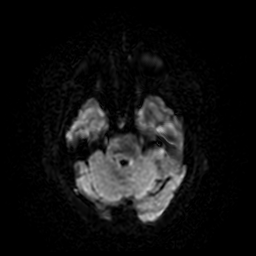
[im 929/1690]
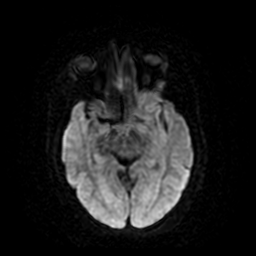
[im 1183/1690]
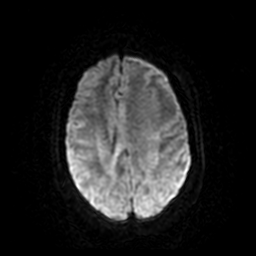
[im 1436/1690]
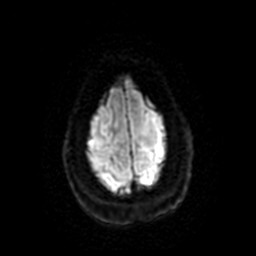
[im 1605/1690]
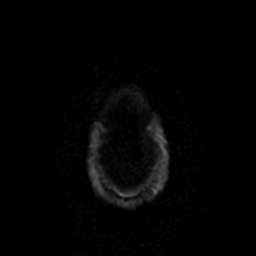

[Series 6: (person_name) · axial · 3.0mm · 0.47mm/px · 1 of 132 slices shown]
[im 1/132]
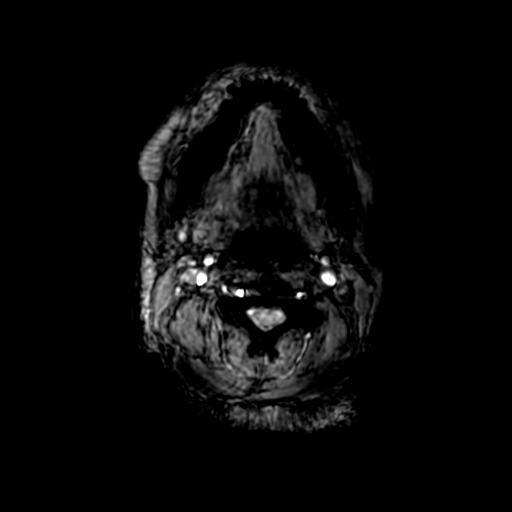

[Series 7: ax 3(person_name) · axial · 1.0mm · 1.02mm/px · z∈[-52,+145]mm · 3 of 198 slices shown (1 of 2)]
[im 1/198]
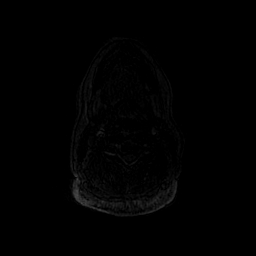
[im 99/198]
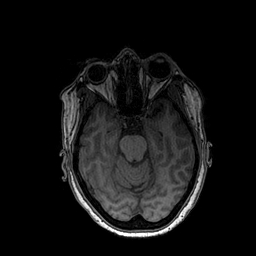
[im 198/198]
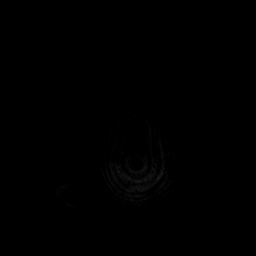

[Series 8: T2 post-contrast · coronal · 3.0mm · 0.39mm/px · 1 of 70 slices shown]
[im 1/70]
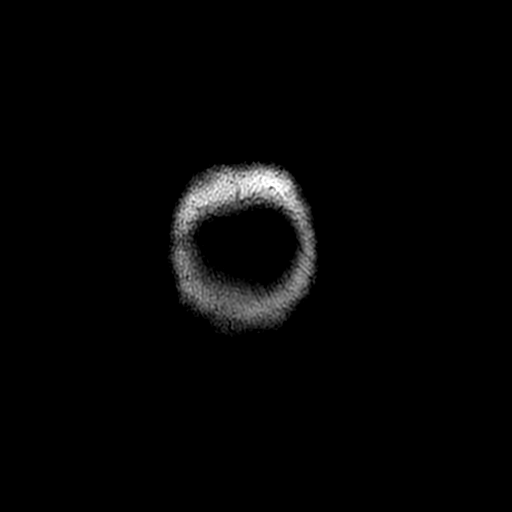

[Series 9: ax 3(person_name) · axial · 1.0mm · 1.02mm/px · z∈[-52,+145]mm · 3 of 198 slices shown (2 of 2)]
[im 1/198]
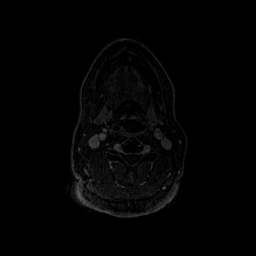
[im 99/198]
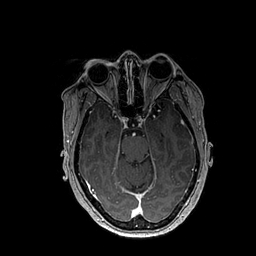
[im 198/198]
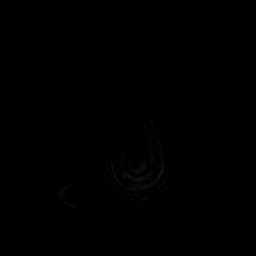

[Series 10: T1 · coronal · 3.0mm · 0.39mm/px · 1 of 70 slices shown]
[im 1/70]
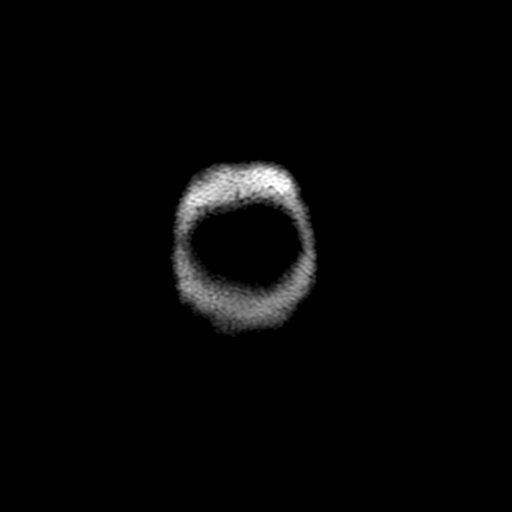

[Series 11: FLAIR · sagittal · 3.0mm · 0.47mm/px · 1 of 43 slices shown (2 of 2)]
[im 1/43]
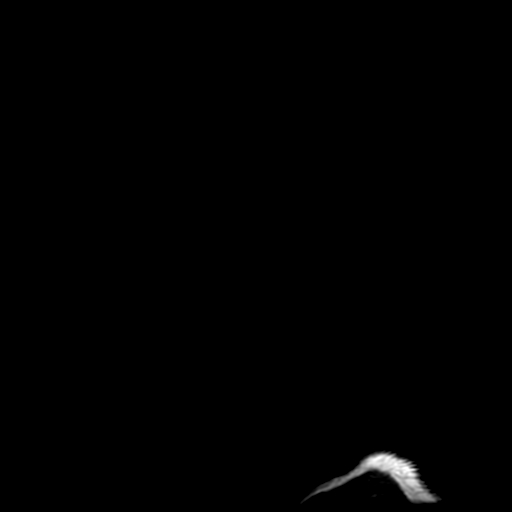

[Series 450: trace · axial · 3.0mm · 0.94mm/px · 1 of 65 slices shown]
[im 1/65]
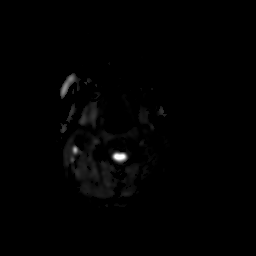

[Series 451: fa(no-q) · axial · 3.0mm · 0.94mm/px · 1 of 62 slices shown]
[im 1/62]
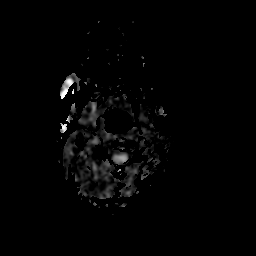

[Series 452: avdc (10^-6 mm²/s)(no-q) · axial · 3.0mm · 0.94mm/px · 1 of 65 slices shown]
[im 1/65]
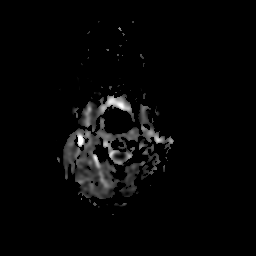

[Series 500: multiplanar reconstruction (mpr) · axial · 1.0mm · 0.50mm/px · z∈[-87,+169]mm · 3 of 257 slices shown (1 of 2)]
[im 1/257]
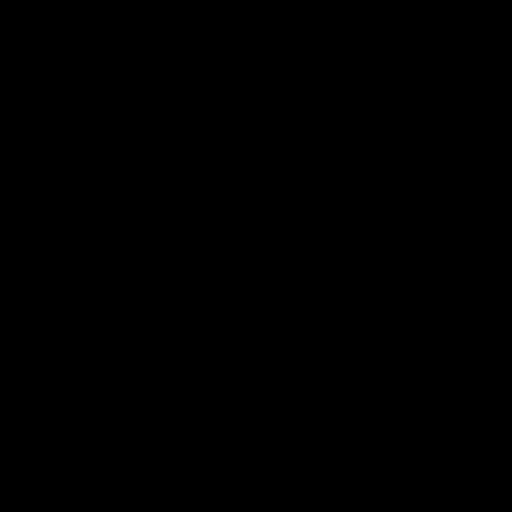
[im 129/257]
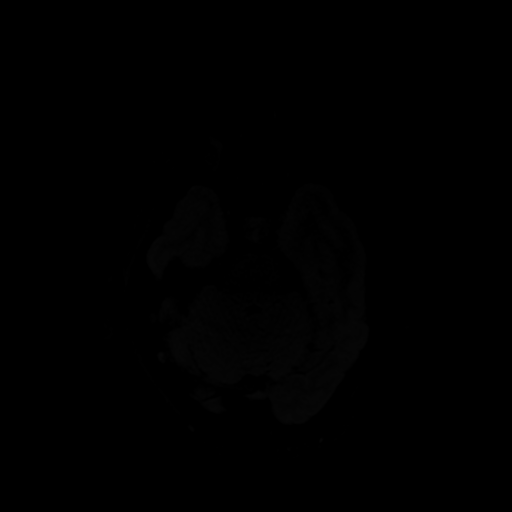
[im 257/257]
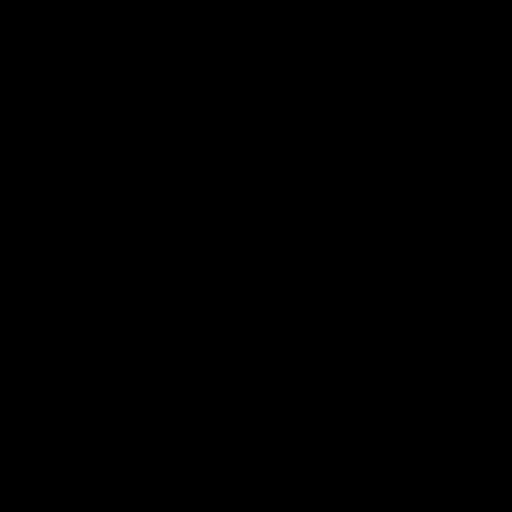

[Series 501: multiplanar reconstruction (mpr) · coronal · 1.0mm · 0.50mm/px · 3 of 243 slices shown (2 of 2)]
[im 1/243]
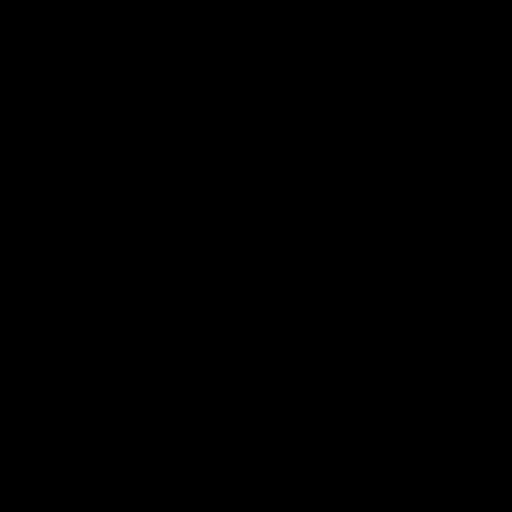
[im 122/243]
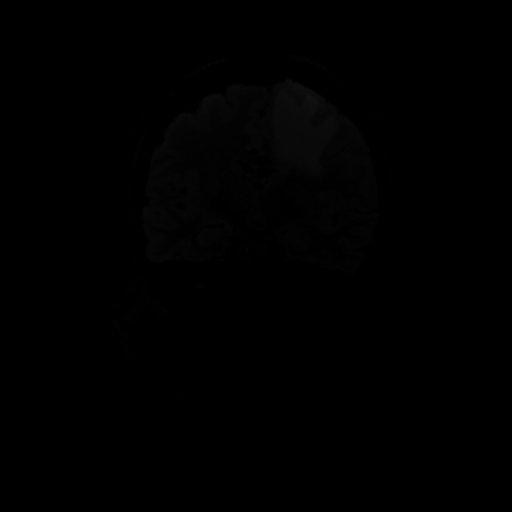
[im 243/243]
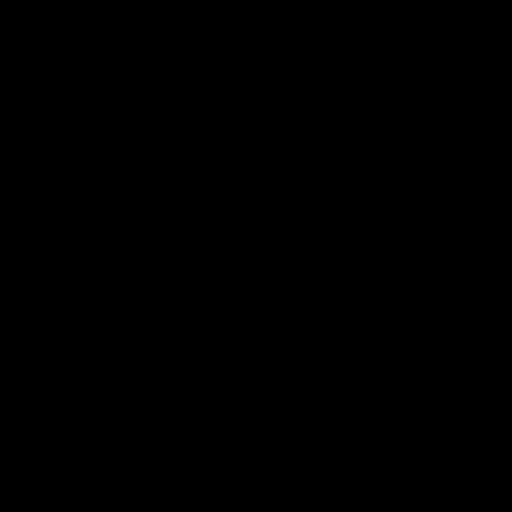

[Series 600: filt_pha: (person_name) · axial · 3.0mm · 0.47mm/px · z∈[-20,+174]mm · 2 of 132 slices shown]
[im 1/132]
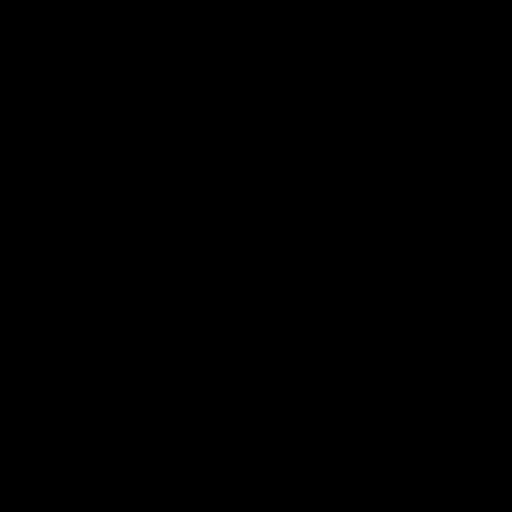
[im 132/132]
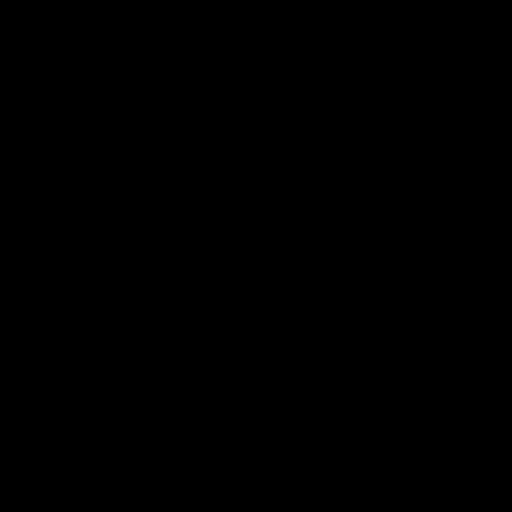

[32 of 48 positions shown; findings below may reference images not displayed]

FINDINGS: Brain: Stable heterogeneously enhancing mass of the left frontal
lobe measuring up to 2.8 cm. This extends to the cortical margin.
Enhancement within adjacent sulci is probably vascular. Extensive
surrounding edema with stable regional mass effect. No new finding.

Vascular: Major vessel flow voids at the skull base are preserved.

Skull and upper cervical spine: Normal marrow signal is preserved.

Sinuses/Orbits: Paranasal sinuses are aerated. Orbits are
unremarkable.

Other: Sella is unremarkable.  Mastoid air cells are clear.
IMPRESSION: Unchanged 2.8 cm left frontal lobe mass with associated edema and
regional mass effect. Mass makes dural contact but is not
definitively extra-axial.

## 2021-10-09 MED ORDER — GADOBUTROL 1 MMOL/ML IV SOLN
8.5000 mL | Freq: Once | INTRAVENOUS | Status: AC | PRN
  Administered 2021-10-09: 17:00:00 8.5 mL via INTRAVENOUS

## 2021-10-09 MED ORDER — LEVETIRACETAM IN NACL 1000 MG/100ML IV SOLN
1000.0000 mg | Freq: Two times a day (BID) | INTRAVENOUS | Status: DC
  Administered 2021-10-09 – 2021-10-12 (×7): 1000 mg via INTRAVENOUS
  Filled 2021-10-09 (×7): qty 100

## 2021-10-09 NOTE — Progress Notes (Signed)
°  Transition of Care [facility]) Screening Note   Patient Details  Name: Shannon Hayes Date of Birth: Oct 22, 1970   [facility] CM/SW Contact:    Pollie Friar, RN Phone Number: 10/09/2021, 2:08 PM    Transition of Care Department [facility]) has reviewed patient. We will continue to monitor patient advancement through interdisciplinary progression rounds. If new patient transition needs arise, please place a TOC consult.

## 2021-10-09 NOTE — Procedures (Signed)
Patient Name: Shannon Hayes  MRN: [id number]  Epilepsy Attending: Lora Havens  Referring Physician/Provider: Greta Doom, MD Date: 10/09/2021 Duration: 22.25 mins  Patient history: 51 year old female with a single mass in L frontal lobe of unknown etiology who experienced a single seizure on 1/24.  EEG to evaluate for seizure.  Level of alertness: Awake, asleep  AEDs during EEG study: LEV  Technical aspects: This EEG study was done with scalp electrodes positioned according to the 10-20 International system of electrode placement. Electrical activity was acquired at a sampling rate of 500Hz  and reviewed with a high frequency filter of 70Hz  and a low frequency filter of 1Hz . EEG data were recorded continuously and digitally stored.   Description: The posterior dominant rhythm consists of 9 Hz activity of moderate voltage (25-35 uV) seen predominantly in posterior head regions, symmetric and reactive to eye opening and eye closing. Sleep was characterized by sleep spindles (12 to 14 Hz), maximal frontocentral region.  Sharp transients were seen in left frontal region. Physiologic photic driving was seen during photic stimulation. Hyperventilation was not performed.     IMPRESSION: This study is within normal limits. No seizures or definite epileptiform discharges were seen throughout the recording.  Bryahna Lesko Barbra Sarks

## 2021-10-09 NOTE — Progress Notes (Signed)
EEG complete - results pending 

## 2021-10-09 NOTE — Progress Notes (Addendum)
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  Subjective: Patient is awake and doing well this morning. She had no further seizures last night and denies pain, muscle aches, weakness, and HA today. Sister present in the room reported that she has never had a seizure prior to that incident.  Exam: Vitals:   10/09/21 0403 10/09/21 0421  BP: 97/78 106/67  Pulse: 75 69  Resp: 18 18  Temp: 98.6 F (37 C) (!) 97.5 F (36.4 C)  SpO2: 99% 100%   Physical Exam  Constitutional: Appears well-developed and well-nourished.  Psych: Affect appropriate to situation Eyes: No scleral injection HENT: No OP obstrucion MSK: no joint deformities.  Cardiovascular: Normal rate and regular rhythm.  Respiratory: Effort normal, non-labored breathing GI: Soft.  No distension. There is no tenderness.  Skin: WDI  Neuro: Mental Status: Patient is awake, alert, oriented to person, month, year, and situation. Place she says is [facility]  Patient is able to give a clear and coherent history. No signs of aphasia or neglect Cranial Nerves: II: Visual Fields are full. Pupils are equal, round, and reactive to light.   III,IV, VI: EOMI without ptosis or diploplia. However, R sided gaze preference, as patient can only keep gaze shifted to the L for approx 5 seconds before shifting R. V: Facial sensation is symmetric to light touch VII: Facial movement is symmetric resting and smiling VIII: Hearing is intact to voice X: Palate elevates symmetrically XI: Shoulder shrug is symmetric. XII: Tongue protrudes midline without atrophy or fasciculations.  Motor: Tone is normal. Bulk is normal. 5/5 strength was present in all four extremities.   Sensory: Sensation is symmetric to light touch in the arms and legs. No extinction to DSS present.  Deep Tendon Reflexes: 2+ and symmetric in the biceps and patellae.  Cerebellar: FNF  intact bilaterally Gait: Deferred  Pertinent Labs: CBC, CMP unremarkable HIV NR  Imaging Reviewed: CT Head: Edema in L frontal lobe with mass effect shifting R toward midline. CT Cervical Spine: No acute hemorrhagic changes nor osseous lesions/fractures. MRI: L frontal lobe mass with significant edema.   Assessment: Patient is a 51 year old female with a single mass in L frontal lobe of unknown etiology who experienced a single seizure on 1/24. Mass may be a metastasis from her breast cancer vs. Meningioma vs. Other primary CNS neoplasm. Will need biopsy to determine source; neurosurgery on board. Mass likely etiology of seizure, as no other source identified.    Recommendations: 1)Continue Keppra 1g IV BID until patient able to tolerate PO 2) Agree with Decadron dosing 3)EEG normal 4)Appreciate neurosurgery recs: Tumor resection to occur on 1/27. 5)Advise to follow up with neurosurgery and neuro-oncology outpatient  Pt seen by Neuro Psych Resident and later by attending MD. Note/plan to be edited by MD as needed.    Rosezetta Schlatter, MD PGY-1 10/09/2021  8:23 AM    Attending Neurohospitalist Addendum Patient seen and examined with APP/Resident. Agree with the history and physical as documented above. Agree with the plan as documented, which I helped formulate. I have independently reviewed the chart, obtained history, review of systems and examined the patient.I have personally reviewed pertinent head/neck/spine imaging (CT/MRI). Please feel free to call with any questions.  --  Amie Portland, MD Neurologist [facility] Pager: [phone]

## 2021-10-09 NOTE — Progress Notes (Addendum)
PROGRESS NOTE  Shannon Hayes ZSW:[id number] DOB: 1970-12-20 DOA: 10/08/2021 PCP: Pcp, No  HPI/Recap of past 24 hours: [name] with hx of breast cancer s/p lumpectomy, XRT, chemo followed by Dr. Marylou Mccoy with [facility]/on[facility], presents to the ER today from her office at [facility].  Patient reportedly had a seizure and fell to the ground.  Patient was brought in unconscious.  Patient lives in [place] but works in [place].  Her base hospital is [facility] in [place].   Laboratory evaluation here in the ER showed bicarbonate of 18, glucose of 128 otherwise unremarkable.   CT head showed large frontal edema.  With regional mass-effect and right midline shift.   Brain MRI demonstrated 2.8 cm left frontal lobe mass.  There is surrounding vasogenic edema.  There is effacement of the ventricle.  With a midline shift.   Neurology and neurosurgery both consulted.   Patient loaded with IV Ativan and Keppra.   [facility] hospital[facility] contacted for admission.    ED Course: CT head shows frontal mass and edema. MRI shows brain mass. Neurology and neurosurgery have been consulted.  10/09/2021: Patient was seen and examined at her bedside.  She is alert and oriented x3.  Speech is fluent.  She follows commands and strength and sensation are intact.  She was seen by neurosurgery, plan for Left frontal craniotomy for resection of tumor on Friday.  Responding well to IV Keppra and IV Decadron.  Assessment/Plan: Principal Problem:   Frontal mass of brain Active Problems:   Malignant neoplasm of overlapping sites of left breast in female, estrogen receptor positive ([place])  Left breast cancer, estrogen receptor positive Followed at [facility]. Completed lumpectomy, chemotherapy and radiation.  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 Seen by neurology and neurosurgery She received a loading dose of IV Decadron and IV Keppra.  Currently  on IV Decadron 8 mg twice daily and Keppra 1000 g twice daily. No recurrent seizures Continue seizures precautions Plan for left frontal craniotomy and resection of tumor on Friday by neurosurgery.  Mild non-anion gap metabolic acidosis Serum bicarb 21 Continue gentle IV fluid hydration LR 50 cc/h Repeat BMP in the morning  Obesity BMI 31 Recommend weight loss outpatient with regular physical activity and healthy dieting.   Critical care time: 65 minutes   Code Status: Full code  Family Communication: Sister and nephew at bedside.  Disposition Plan: Likely will discharge to home once neurosurgery consult.   Consultants: Neurosurgery Neurology  Procedures: None  Antimicrobials: None  DVT prophylaxis: Subcu Lovenox daily.  Status is: Inpatient  Patient requires at least 2 midnights for further evaluation and treatment of present condition.      Objective: Vitals:   10/09/21 0357 10/09/21 0400 10/09/21 0403 10/09/21 0421  BP: 102/70 97/68 97/78  106/67  Pulse: 76  75 69  Resp: 19 18 18 18   Temp:   98.6 F (37 C) (!) 97.5 F (36.4 C)  TempSrc:    Oral  SpO2: 98%  99% 100%  Weight:    81.4 kg  Height:    5\' 3"  (1.6 m)   No intake or output data in the 24 hours ending 10/09/21 1040 Filed Weights   10/08/21 1559 10/09/21 0421  Weight: 72.6 kg 81.4 kg    Exam:  General: 51 y.o. year-old female well developed well nourished in no acute distress.  Alert and oriented x3. Cardiovascular: Regular rate and rhythm with no rubs or gallops.  No thyromegaly or JVD noted.   Respiratory: Clear to auscultation with no wheezes or rales. Good inspiratory effort. Abdomen: Soft nontender nondistended with normal bowel sounds x4 quadrants. Musculoskeletal: No lower extremity edema. 2/4 pulses in all 4 extremities. Skin: No ulcerative lesions noted or rashes, Psychiatry: Mood is appropriate for condition and setting   Data Reviewed: CBC: Recent Labs  Lab  10/08/21 1627 10/08/21 1634 10/09/21 0313  WBC 5.0  --  5.3  NEUTROABS 4.0  --  4.9  HGB 12.9 12.9 12.7  HCT 38.8 38.0 37.8  MCV 95.3  --  92.9  PLT 197  --  902   Basic Metabolic Panel: Recent Labs  Lab 10/08/21 1627 10/08/21 1634 10/09/21 0313  NA 137 138 135  K 4.6 4.3 3.9  CL 104 106 107  CO2 18*  --  21*  GLUCOSE 128* 136* 140*  BUN 12 15 9   CREATININE 0.90 0.80 0.83  CALCIUM 9.3  --  9.7  MG  --   --  2.0   GFR: Estimated Creatinine Clearance: 82.8 mL/min (by C-G formula based on SCr of 0.83 mg/dL). Liver Function Tests: Recent Labs  Lab 10/08/21 1627 10/09/21 0313  AST 44* 22  ALT 10 11  ALKPHOS 54 65  BILITOT 1.5* 0.5  PROT 6.8 6.9  ALBUMIN 3.7 3.6   No results for input(s): LIPASE, AMYLASE in the last 168 hours. No results for input(s): AMMONIA in the last 168 hours. Coagulation Profile: No results for input(s): INR, PROTIME in the last 168 hours. Cardiac Enzymes: No results for input(s): CKTOTAL, CKMB, CKMBINDEX, TROPONINI in the last 168 hours. BNP (last 3 results) No results for input(s): PROBNP in the last 8760 hours. HbA1C: No results for input(s): HGBA1C in the last 72 hours. CBG: Recent Labs  Lab 10/08/21 1607 10/09/21 0206 10/09/21 0625  GLUCAP 133* 150* 126*   Lipid Profile: No results for input(s): CHOL, HDL, LDLCALC, TRIG, CHOLHDL, LDLDIRECT in the last 72 hours. Thyroid Function Tests: No results for input(s): TSH, T4TOTAL, FREET4, T3FREE, THYROIDAB in the last 72 hours. Anemia Panel: No results for input(s): VITAMINB12, FOLATE, FERRITIN, TIBC, IRON, RETICCTPCT in the last 72 hours. Urine analysis:    Component Value Date/Time   COLORURINE YELLOW 10/09/2021 0515   APPEARANCEUR CLEAR 10/09/2021 0515   LABSPEC 1.017 10/09/2021 0515   PHURINE 7.0 10/09/2021 0515   GLUCOSEU NEGATIVE 10/09/2021 0515   HGBUR NEGATIVE 10/09/2021 0515   BILIRUBINUR NEGATIVE 10/09/2021 0515   KETONESUR NEGATIVE 10/09/2021 0515   PROTEINUR  NEGATIVE 10/09/2021 0515   NITRITE NEGATIVE 10/09/2021 0515   LEUKOCYTESUR NEGATIVE 10/09/2021 0515   Sepsis Labs: @LABRCNTIP (procalcitonin:4,lacticidven:4)  ) Recent Results (from the past 240 hour(s))  Resp Panel by RT-PCR (Flu A&B, Covid) Nasopharyngeal Swab     Status: None   Collection Time: 10/08/21  3:51 PM   Specimen: Nasopharyngeal Swab; Nasopharyngeal(NP) swabs in vial transport medium  Result Value Ref Range Status   SARS Coronavirus 2 by RT PCR NEGATIVE NEGATIVE Final    Comment: (NOTE) SARS-CoV-2 target nucleic acids are NOT DETECTED.  The SARS-CoV-2 RNA is generally detectable in upper respiratory specimens during the acute phase of infection. The lowest concentration of SARS-CoV-2 viral copies this assay can detect is 138 copies/mL. A negative result does not preclude SARS-Cov-2 infection and should not be used as the sole basis for treatment or other patient management decisions. A negative result may occur with  improper specimen collection/handling, submission of specimen other than nasopharyngeal swab, presence  of viral mutation(s) within the areas targeted by this assay, and inadequate number of viral copies(<138 copies/mL). A negative result must be combined with clinical observations, patient history, and epidemiological information. The expected result is Negative.  Fact Sheet for Patients:  EntrepreneurPulse.com.au  Fact Sheet for Healthcare Providers:  IncredibleEmployment.be  This test is no t yet approved or cleared by the [place] FDA and  has been authorized for detection and/or diagnosis of SARS-CoV-2 by FDA under an Emergency Use Authorization (EUA). This EUA will remain  in effect (meaning this test can be used) for the duration of the COVID-19 declaration under Section 564(b)(1) of the Act, 21 U.S.C.section 360bbb-3(b)(1), unless the authorization is terminated  or revoked sooner.       Influenza A  by PCR NEGATIVE NEGATIVE Final   Influenza B by PCR NEGATIVE NEGATIVE Final    Comment: (NOTE) The Xpert Xpress SARS-CoV-2/FLU/RSV plus assay is intended as an aid in the diagnosis of influenza from Nasopharyngeal swab specimens and should not be used as a sole basis for treatment. Nasal washings and aspirates are unacceptable for Xpert Xpress SARS-CoV-2/FLU/RSV testing.  Fact Sheet for Patients: EntrepreneurPulse.com.au  Fact Sheet for Healthcare Providers: IncredibleEmployment.be  This test is not yet approved or cleared by the [place] FDA and has been authorized for detection and/or diagnosis of SARS-CoV-2 by FDA under an Emergency Use Authorization (EUA). This EUA will remain in effect (meaning this test can be used) for the duration of the COVID-19 declaration under Section 564(b)(1) of the Act, 21 U.S.C. section 360bbb-3(b)(1), unless the authorization is terminated or revoked.  Performed at [facility], [place] [address], [place], [place] [address]       Studies: CT Head Wo Contrast  Result Date: 10/08/2021 CLINICAL DATA:  Mental status change, unknown cause possible seizure, h/o breast cancer EXAM: CT HEAD WITHOUT CONTRAST TECHNIQUE: Contiguous axial images were obtained from the base of the skull through the vertex without intravenous contrast. RADIATION DOSE REDUCTION: This exam was performed according to the departmental dose-optimization program which includes automated exposure control, adjustment of the mA and/or kV according to patient size and/or use of iterative reconstruction technique. COMPARISON:  None. FINDINGS: Brain: There is significant edema in the left frontal lobe. There could be some loss of gray differentiation is region. Effacement of adjacent ventricles with rightward midline shift measuring 4 mm. No hydrocephalus. Remainder of brain is unremarkable. No acute intracranial hemorrhage. Vascular: There is mild  atherosclerotic calcification at the skull base. Skull: Calvarium is unremarkable. Sinuses/Orbits: No acute finding. Other: None. IMPRESSION: Left frontal edema, probably vasogenic and secondary to an underlying metastasis given history. Regional mass effect is present with mild rightward midline shift. Contrast enhanced MRI is recommended. These results were called by telephone at the time of interpretation on 10/08/2021 at 5:47 pm to provider [facility] , who verbally acknowledged these results. Electronically Signed   By: Macy Mis M.D.   On: 10/08/2021 17:47   CT Cervical Spine Wo Contrast  Result Date: 10/08/2021 CLINICAL DATA:  Neck trauma, dangerous injury mechanism (Age 22-64y) EXAM: CT CERVICAL SPINE WITHOUT CONTRAST TECHNIQUE: Multidetector CT imaging of the cervical spine was performed without intravenous contrast. Multiplanar CT image reconstructions were also generated. RADIATION DOSE REDUCTION: This exam was performed according to the departmental dose-optimization program which includes automated exposure control, adjustment of the mA and/or kV according to patient size and/or use of iterative reconstruction technique. COMPARISON:  None. FINDINGS: Alignment: Preserved. Skull base and vertebrae:  Vertebral body heights are maintained. No acute fracture. No destructive osseous lesion. Soft tissues and spinal canal: No prevertebral fluid or swelling. No visible canal hematoma. Disc levels:  Mild degenerative changes at C4-C5. Upper chest: No apical lung mass. Other: Partially imaged right chest wall port catheter. IMPRESSION: No acute cervical spine fracture. Electronically Signed   By: Macy Mis M.D.   On: 10/08/2021 17:49   MR BRAIN W WO CONTRAST  Result Date: 10/08/2021 CLINICAL DATA:  Mental status change, history of breast cancer, seizure, possible metastasis on head CT EXAM: MRI HEAD WITHOUT AND WITH CONTRAST TECHNIQUE: Multiplanar, multiecho pulse sequences of the brain and  surrounding structures were obtained without and with intravenous contrast. CONTRAST:  7.23mL GADAVIST GADOBUTROL 1 MMOL/ML IV SOLN COMPARISON:  10/08/2021 FINDINGS: Brain: Heterogeneously enhancing mass in the left frontal lobe, which measures 2.8 x 2.1 x 2.0 cm (AP x TR x CC) (series 23, image 37 and series 24, image 20). Significant surrounding T2 hyperintense signal, most likely edema, which causes local mass effect with effacement of the left-greater-than-right lateral ventricle and approximately 5 mm of left-to-right midline shift. No restricted diffusion to suggest acute infarct. No acute hemorrhage. No hydrocephalus or extra-axial collection. The hippocampi are symmetric in size and normal in signal. No evidence of gray matter heterotopia or underlying cortical dysplasia. Vascular: Normal flow voids. The ACA flow voids are deviated to the right by the edema surrounding the mass, but appear patent. Skull and upper cervical spine: Normal marrow signal. Sinuses/Orbits: Negative. Other: The mastoids are well aerated. IMPRESSION: 1. 2.8 cm left frontal lobe mass, favored to represent a metastatic lesion, given the patient's history of breast cancer and degree of surrounding edema, versus a primary CNS neoplasm. 2. Edema surrounding left frontal lobe mass with local mass effect, effacement of the left-greater-than-right lateral ventricles, and 5 mm of left-to-right midline shift. 3. No other seizure etiology identified. Electronically Signed   By: Merilyn Baba M.D.   On: 10/08/2021 21:32    Scheduled Meds:  dexamethasone (DECADRON) injection  8 mg Intravenous Q12H    Continuous Infusions:  acetaminophen     lactated ringers 50 mL/hr at 10/08/21 2330   levETIRAcetam 1,000 mg (10/09/21 0904)     LOS: 1 day     Kayleen Memos, MD [facility] Pager [phone]  If 7PM-7AM, please contact night-coverage [email or web address] Password [email or web address] 10/09/2021, 10:40 AM

## 2021-10-09 NOTE — Progress Notes (Signed)
Looks very good this morning.  Wide-awake.  No headache.  No further seizures.  No speech or language difficulty.  No feelings of numbness or weakness.  Vital signs are stable and she is afebrile.  Motor 5/5 bilaterally.  No drift.  No cranial neuropathy.  Speech fluent.  Judgment insight appear intact.  Patient with a left frontal dural based tumor with significant surrounding edema and mass-effect.  Tumor type unknown-metastatic breast carcinoma versus meningioma.  I have recommended we move forward with a left frontal craniotomy and resection of tumor on Friday.  I discussed the risks involved with surgery including but limited risk of anesthesia, bleeding, infection, CSF leak, brain injury including stroke, seizure or even death.  I have discussed the risks of tumor recurrence and need for adjunctive therapies.  The patient is aware and wishes to proceed.  She should continue with her Decadron and Keppra.

## 2021-10-10 ENCOUNTER — Encounter (HOSPITAL_COMMUNITY): Payer: Self-pay | Admitting: Internal Medicine

## 2021-10-10 LAB — CBC
HCT: 34.4 % — ABNORMAL LOW (ref 36.0–46.0)
Hemoglobin: 11.9 g/dL — ABNORMAL LOW (ref 12.0–15.0)
MCH: 31.7 pg (ref 26.0–34.0)
MCHC: 34.6 g/dL (ref 30.0–36.0)
MCV: 91.7 fL (ref 80.0–100.0)
Platelets: 206 10*3/uL (ref 150–400)
RBC: 3.75 MIL/uL — ABNORMAL LOW (ref 3.87–5.11)
RDW: 12.5 % (ref 11.5–15.5)
WBC: 7.4 10*3/uL (ref 4.0–10.5)
nRBC: 0 % (ref 0.0–0.2)

## 2021-10-10 LAB — BASIC METABOLIC PANEL
Anion gap: 6 (ref 5–15)
BUN: 18 mg/dL (ref 6–20)
CO2: 26 mmol/L (ref 22–32)
Calcium: 9.2 mg/dL (ref 8.9–10.3)
Chloride: 108 mmol/L (ref 98–111)
Creatinine, Ser: 0.8 mg/dL (ref 0.44–1.00)
GFR, Estimated: >60 mL/min (ref >60)
Glucose, Bld: 126 mg/dL — ABNORMAL HIGH (ref 70–99)
Potassium: 4.1 mmol/L (ref 3.5–5.1)
Sodium: 140 mmol/L (ref 135–145)

## 2021-10-10 LAB — PHOSPHORUS: Phosphorus: 3.8 mg/dL (ref 2.5–4.6)

## 2021-10-10 LAB — GLUCOSE, CAPILLARY
Glucose-Capillary: 134 mg/dL — ABNORMAL HIGH (ref 70–99)
Glucose-Capillary: 143 mg/dL — ABNORMAL HIGH (ref 70–99)
Glucose-Capillary: 110 mg/dL — ABNORMAL HIGH (ref 70–99)

## 2021-10-10 LAB — MAGNESIUM: Magnesium: 2.2 mg/dL (ref 1.7–2.4)

## 2021-10-10 MED ORDER — LACTATED RINGERS IV SOLN: INTRAVENOUS | Status: DC

## 2021-10-10 NOTE — Progress Notes (Signed)
PROGRESS NOTE  Shannon Hayes RFF:[id number] DOB: [birth year] DOA: 10/08/2021 PCP: Pcp, No  HPI/Recap of past 24 hours: [name] with hx of breast cancer s/p lumpectomy, XRT, chemo followed by Dr. Marylou Mccoy with [facility] heme/onc, presents to [facility] ER from her office at [employer]   Patient reportedly had a seizure and fell to the ground.  Patient was brought in unconscious.  Patient lives in [place] but works in [place].  Her base hospital is [facility] in [place].  Work-up revealed a 2.8 cm left frontal lobe mass, seen on CT scan and confirmed on MRI brain.  CT head showed large frontal edema.  With regional mass-effect and right midline shift.  Brain MRI demonstrated 2.8 cm left frontal lobe mass with surrounding vasogenic edema.  There is effacement of the ventricle.  With a midline shift.  Neurology and neurosurgery were both consulted.  Patient received a loading dose of IV Decadron and IV Keppra.  Admitted by hospitalist service.  Plan for left frontal craniotomy and resection of tumor on 10/11/2021.  N.p.o. after midnight.  Assessment/Plan: Principal Problem:   Frontal mass of brain Active Problems:   Malignant neoplasm of overlapping sites of left breast in female, estrogen receptor positive ([place])  Left breast cancer, estrogen receptor positive Followed at [facility] cancer center. Completed lumpectomy, chemotherapy and radiation.  Newly diagnosed frontal mass of brain with concern for metastatic breast cancer versus meningioma. Seen by neurology and neurosurgery She received a loading dose of IV Decadron and IV Keppra.  Currently on IV Decadron 8 mg twice daily and Keppra 1000 g twice daily, continue. No recurrent seizures, continue seizure precautions. Plan for left frontal craniotomy and resection of tumor on Friday 10/11/2021 by neurosurgery.  Resolved mild non-anion gap metabolic acidosis Serum bicarb 21> 26. Continue gentle IV fluid hydration LR 50 cc/h,  decrease rate to 30 cc/h. Repeat BMP in the morning  Obesity BMI 31 Recommend weight loss outpatient with regular physical activity and healthy dieting.   Critical care time: 65 minutes   Code Status: Full code  Family Communication: Family member at bedside.  Disposition Plan: Likely will discharge to home once neurosurgery consult.   Consultants: Neurosurgery Neurology Neurology oncology, Dr. Mickeal Skinner on 10/10/2021.  Procedures: None  Antimicrobials: None  DVT prophylaxis: Subcu Lovenox daily.  Status is: Inpatient  Patient requires at least 2 midnights for further evaluation and treatment of present condition.      Objective: Vitals:   10/10/21 0801 10/10/21 0802 10/10/21 1222 10/10/21 1527  BP:  118/71 95/60 124/79  Pulse: 70  64 64  Resp: 20 20 20 20   Temp: 98.6 F (37 C)  98.7 F (37.1 C) 98.2 F (36.8 C)  TempSrc: Oral  Oral Oral  SpO2: 100%  100%   Weight:      Height:        Intake/Output Summary (Last 24 hours) at 10/10/2021 1604 Last data filed at 10/10/2021 0900 Gross per 24 hour  Intake 1309.92 ml  Output --  Net 1309.92 ml   Filed Weights   10/08/21 1559 10/09/21 0421 10/10/21 0637  Weight: 72.6 kg 81.4 kg 81.4 kg    Exam:  General: 51 y.o. year-old female well-developed well-nourished in no acute distress.  She is alert and wanted x3.   Cardiovascular: Regular rate and rhythm no rubs or gallops. Respiratory: Clear to auscultation no wheezes or rales.   Abdomen: Soft nontender normal bowel sounds present. Musculoskeletal: No lower extremity edema bilaterally. Skin: No  ulcerative lesions noted. Psychiatry: Mood is appropriate for condition 7   Data Reviewed: CBC: Recent Labs  Lab 10/08/21 1627 10/08/21 1634 10/09/21 0313 10/10/21 0712  WBC 5.0  --  5.3 7.4  NEUTROABS 4.0  --  4.9  --   HGB 12.9 12.9 12.7 11.9*  HCT 38.8 38.0 37.8 34.4*  MCV 95.3  --  92.9 91.7  PLT 197  --  203 277   Basic Metabolic Panel: Recent  Labs  Lab 10/08/21 1627 10/08/21 1634 10/09/21 0313 10/10/21 0712  NA 137 138 135 140  K 4.6 4.3 3.9 4.1  CL 104 106 107 108  CO2 18*  --  21* 26  GLUCOSE 128* 136* 140* 126*  BUN 12 15 9 18   CREATININE 0.90 0.80 0.83 0.80  CALCIUM 9.3  --  9.7 9.2  MG  --   --  2.0 2.2  PHOS  --   --   --  3.8   GFR: Estimated Creatinine Clearance: 85.9 mL/min (by C-G formula based on SCr of 0.8 mg/dL). Liver Function Tests: Recent Labs  Lab 10/08/21 1627 10/09/21 0313  AST 44* 22  ALT 10 11  ALKPHOS 54 65  BILITOT 1.5* 0.5  PROT 6.8 6.9  ALBUMIN 3.7 3.6   No results for input(s): LIPASE, AMYLASE in the last 168 hours. No results for input(s): AMMONIA in the last 168 hours. Coagulation Profile: No results for input(s): INR, PROTIME in the last 168 hours. Cardiac Enzymes: No results for input(s): CKTOTAL, CKMB, CKMBINDEX, TROPONINI in the last 168 hours. BNP (last 3 results) No results for input(s): PROBNP in the last 8760 hours. HbA1C: No results for input(s): HGBA1C in the last 72 hours. CBG: Recent Labs  Lab 10/09/21 0625 10/09/21 1125 10/09/21 2330 10/10/21 0618 10/10/21 1222  GLUCAP 126* 131* 146* 134* 143*   Lipid Profile: No results for input(s): CHOL, HDL, LDLCALC, TRIG, CHOLHDL, LDLDIRECT in the last 72 hours. Thyroid Function Tests: No results for input(s): TSH, T4TOTAL, FREET4, T3FREE, THYROIDAB in the last 72 hours. Anemia Panel: No results for input(s): VITAMINB12, FOLATE, FERRITIN, TIBC, IRON, RETICCTPCT in the last 72 hours. Urine analysis:    Component Value Date/Time   COLORURINE YELLOW 10/09/2021 0515   APPEARANCEUR CLEAR 10/09/2021 0515   LABSPEC 1.017 10/09/2021 0515   PHURINE 7.0 10/09/2021 0515   GLUCOSEU NEGATIVE 10/09/2021 0515   HGBUR NEGATIVE 10/09/2021 0515   BILIRUBINUR NEGATIVE 10/09/2021 0515   KETONESUR NEGATIVE 10/09/2021 0515   PROTEINUR NEGATIVE 10/09/2021 0515   NITRITE NEGATIVE 10/09/2021 0515   LEUKOCYTESUR NEGATIVE 10/09/2021  0515   Sepsis Labs: @LABRCNTIP (procalcitonin:4,lacticidven:4)  ) Recent Results (from the past 240 hour(s))  Resp Panel by RT-PCR (Flu A&B, Covid) Nasopharyngeal Swab     Status: None   Collection Time: 10/08/21  3:51 PM   Specimen: Nasopharyngeal Swab; Nasopharyngeal(NP) swabs in vial transport medium  Result Value Ref Range Status   SARS Coronavirus 2 by RT PCR NEGATIVE NEGATIVE Final    Comment: (NOTE) SARS-CoV-2 target nucleic acids are NOT DETECTED.  The SARS-CoV-2 RNA is generally detectable in upper respiratory specimens during the acute phase of infection. The lowest concentration of SARS-CoV-2 viral copies this assay can detect is 138 copies/mL. A negative result does not preclude SARS-Cov-2 infection and should not be used as the sole basis for treatment or other patient management decisions. A negative result may occur with  improper specimen collection/handling, submission of specimen other than nasopharyngeal swab, presence of viral mutation(s) within the areas  targeted by this assay, and inadequate number of viral copies(<138 copies/mL). A negative result must be combined with clinical observations, patient history, and epidemiological information. The expected result is Negative.  Fact Sheet for Patients:  EntrepreneurPulse.com.au  Fact Sheet for Healthcare Providers:  IncredibleEmployment.be  This test is no t yet approved or cleared by the [place] FDA and  has been authorized for detection and/or diagnosis of SARS-CoV-2 by FDA under an Emergency Use Authorization (EUA). This EUA will remain  in effect (meaning this test can be used) for the duration of the COVID-19 declaration under Section 564(b)(1) of the Act, 21 U.S.C.section 360bbb-3(b)(1), unless the authorization is terminated  or revoked sooner.       Influenza A by PCR NEGATIVE NEGATIVE Final   Influenza B by PCR NEGATIVE NEGATIVE Final    Comment:  (NOTE) The Xpert Xpress SARS-CoV-2/FLU/RSV plus assay is intended as an aid in the diagnosis of influenza from Nasopharyngeal swab specimens and should not be used as a sole basis for treatment. Nasal washings and aspirates are unacceptable for Xpert Xpress SARS-CoV-2/FLU/RSV testing.  Fact Sheet for Patients: EntrepreneurPulse.com.au  Fact Sheet for Healthcare Providers: IncredibleEmployment.be  This test is not yet approved or cleared by the [place] FDA and has been authorized for detection and/or diagnosis of SARS-CoV-2 by FDA under an Emergency Use Authorization (EUA). This EUA will remain in effect (meaning this test can be used) for the duration of the COVID-19 declaration under Section 564(b)(1) of the Act, 21 U.S.C. section 360bbb-3(b)(1), unless the authorization is terminated or revoked.  Performed at [facility], [place] [address], [place], [place] [address]       Studies: MR BRAIN W WO CONTRAST  Result Date: 10/09/2021 CLINICAL DATA:  Brain mass EXAM: MRI HEAD WITHOUT AND WITH CONTRAST TECHNIQUE: Multiplanar, multiecho pulse sequences of the brain and surrounding structures were obtained without and with intravenous contrast. CONTRAST:  8.13mL GADAVIST GADOBUTROL 1 MMOL/ML IV SOLN COMPARISON:  10/08/2021 FINDINGS: Brain: Stable heterogeneously enhancing mass of the left frontal lobe measuring up to 2.8 cm. This extends to the cortical margin. Enhancement within adjacent sulci is probably vascular. Extensive surrounding edema with stable regional mass effect. No new finding. Vascular: Major vessel flow voids at the skull base are preserved. Skull and upper cervical spine: Normal marrow signal is preserved. Sinuses/Orbits: Paranasal sinuses are aerated. Orbits are unremarkable. Other: Sella is unremarkable.  Mastoid air cells are clear. IMPRESSION: Unchanged 2.8 cm left frontal lobe mass with associated edema and regional mass  effect. Mass makes dural contact but is not definitively extra-axial. Electronically Signed   By: Macy Mis M.D.   On: 10/09/2021 16:52    Scheduled Meds:  dexamethasone (DECADRON) injection  8 mg Intravenous Q12H    Continuous Infusions:  lactated ringers 50 mL/hr at 10/10/21 0023   levETIRAcetam 1,000 mg (10/10/21 0836)     LOS: 2 days     Kayleen Memos, MD [facility] Pager [phone]  If 7PM-7AM, please contact night-coverage [email or web address] Password [facility] 10/10/2021, 4:04 PM

## 2021-10-10 NOTE — Progress Notes (Signed)
Overall looks good.  No headache.  Bright and awake.  Oriented and appropriate.  Motor and sensory function intact.  Once again we discussed the planned surgery for tomorrow.  We discussed the risks and the benefits.  Patient seems comfortable with the plan and is ready to move forward tomorrow.

## 2021-10-10 NOTE — Plan of Care (Signed)
  Problem: Health Behavior/Discharge Planning: Goal: Ability to manage health-related needs will improve Outcome: Progressing   

## 2021-10-10 NOTE — H&P (View-Only) (Signed)
Overall looks good.  No headache.  Bright and awake.  Oriented and appropriate.  Motor and sensory function intact.  Once again we discussed the planned surgery for tomorrow.  We discussed the risks and the benefits.  Patient seems comfortable with the plan and is ready to move forward tomorrow.

## 2021-10-11 ENCOUNTER — Inpatient Hospital Stay (HOSPITAL_COMMUNITY): Payer: 59

## 2021-10-11 ENCOUNTER — Inpatient Hospital Stay (HOSPITAL_COMMUNITY): Payer: 59 | Admitting: Certified Registered Nurse Anesthetist

## 2021-10-11 ENCOUNTER — Encounter (HOSPITAL_COMMUNITY): Payer: Self-pay | Admitting: Internal Medicine

## 2021-10-11 ENCOUNTER — Encounter (HOSPITAL_COMMUNITY)
Admission: EM | Disposition: A | Discharge status: Home / Self Care | Payer: Self-pay | Source: Home / Self Care | Attending: Internal Medicine

## 2021-10-11 HISTORY — PX: APPLICATION OF CRANIAL NAVIGATION: SHX6578

## 2021-10-11 HISTORY — PX: CRANIOTOMY: SHX93

## 2021-10-11 LAB — PHOSPHORUS: Phosphorus: 3.2 mg/dL (ref 2.5–4.6)

## 2021-10-11 LAB — ABO/RH
ABO/RH(D): AB NEG
PT AG Type: NEGATIVE

## 2021-10-11 LAB — SURGICAL PCR SCREEN
MRSA, PCR: NEGATIVE
Staphylococcus aureus: NEGATIVE

## 2021-10-11 LAB — CBC
HCT: 36 % (ref 36.0–46.0)
Hemoglobin: 11.9 g/dL — ABNORMAL LOW (ref 12.0–15.0)
MCH: 30.8 pg (ref 26.0–34.0)
MCHC: 33.1 g/dL (ref 30.0–36.0)
MCV: 93.3 fL (ref 80.0–100.0)
Platelets: 200 10*3/uL (ref 150–400)
RBC: 3.86 MIL/uL — ABNORMAL LOW (ref 3.87–5.11)
RDW: 12.6 % (ref 11.5–15.5)
WBC: 7 10*3/uL (ref 4.0–10.5)
nRBC: 0 % (ref 0.0–0.2)

## 2021-10-11 LAB — TYPE AND SCREEN
ABO/RH(D): AB NEG
Antibody Screen: NEGATIVE
PT AG Type: NEGATIVE

## 2021-10-11 LAB — BASIC METABOLIC PANEL
Anion gap: 5 (ref 5–15)
BUN: 14 mg/dL (ref 6–20)
CO2: 25 mmol/L (ref 22–32)
Calcium: 8.8 mg/dL — ABNORMAL LOW (ref 8.9–10.3)
Chloride: 110 mmol/L (ref 98–111)
Creatinine, Ser: 0.8 mg/dL (ref 0.44–1.00)
GFR, Estimated: >60 mL/min (ref >60)
Glucose, Bld: 151 mg/dL — ABNORMAL HIGH (ref 70–99)
Potassium: 4.1 mmol/L (ref 3.5–5.1)
Sodium: 140 mmol/L (ref 135–145)

## 2021-10-11 LAB — MRSA NEXT GEN BY PCR, NASAL: MRSA by PCR Next Gen: NOT DETECTED

## 2021-10-11 LAB — GLUCOSE, CAPILLARY
Glucose-Capillary: 109 mg/dL — ABNORMAL HIGH (ref 70–99)
Glucose-Capillary: 116 mg/dL — ABNORMAL HIGH (ref 70–99)
Glucose-Capillary: 124 mg/dL — ABNORMAL HIGH (ref 70–99)
Glucose-Capillary: 131 mg/dL — ABNORMAL HIGH (ref 70–99)
Glucose-Capillary: 165 mg/dL — ABNORMAL HIGH (ref 70–99)

## 2021-10-11 LAB — MAGNESIUM: Magnesium: 2.2 mg/dL (ref 1.7–2.4)

## 2021-10-11 IMAGING — MR MR HEAD WO/W CM
12 of 14 series · 38 of 48 positions shown · IV contrast (Gadavist)
Comparison: [DATE]

CLINICAL DATA: Surveillance of intracranial neoplasm

EXAM:
MRI HEAD WITHOUT AND WITH CONTRAST
TECHNIQUE: Multiplanar, multiecho pulse sequences of the brain and surrounding
structures were obtained without and with intravenous contrast.
CONTRAST:  8mL GADAVIST GADOBUTROL 1 MMOL/ML IV SOLN

[Series 5: DWI · axial · 3.0mm · 0.88mm/px · z∈[-49,+90]mm · 6 of 96 slices shown (1 of 4)]
[im 1/96]
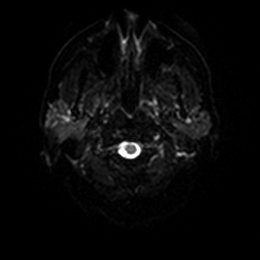
[im 20/96]
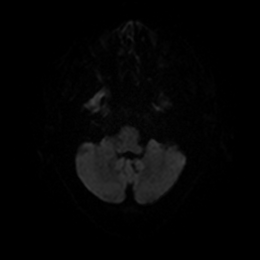
[im 39/96]
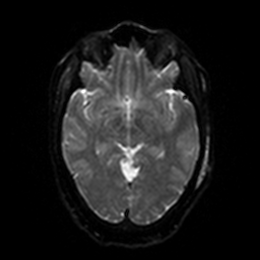
[im 58/96]
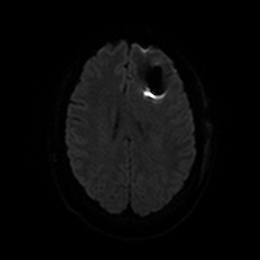
[im 77/96]
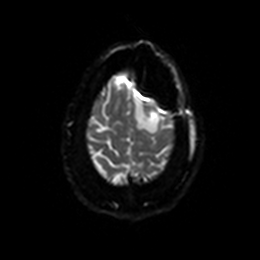
[im 96/96]
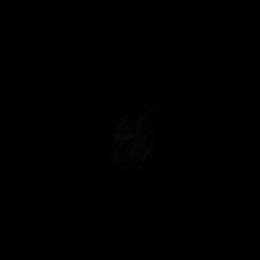

[Series 6: DWI · axial · 3.0mm · 0.88mm/px · z∈[-49,+90]mm · 4 of 48 slices shown (2 of 4)]
[im 1/48]
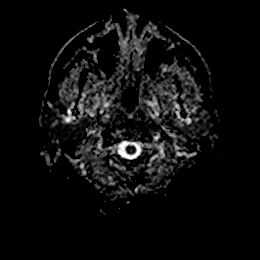
[im 16/48]
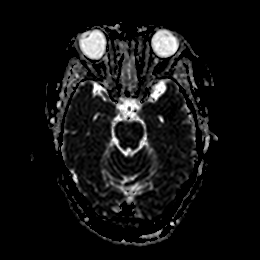
[im 32/48]
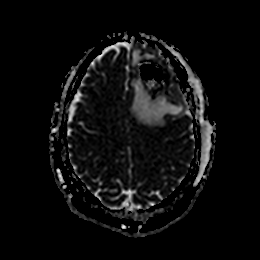
[im 48/48]
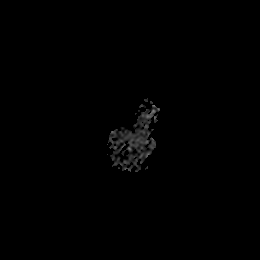

[Series 7: DWI · coronal · 4.0mm · 0.88mm/px · 5 of 64 slices shown (3 of 4)]
[im 1/64]
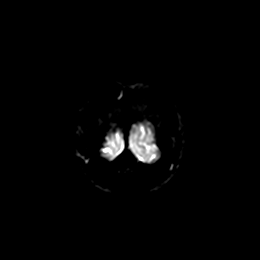
[im 16/64]
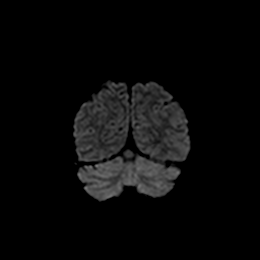
[im 32/64]
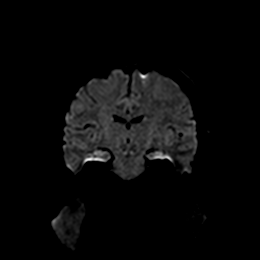
[im 48/64]
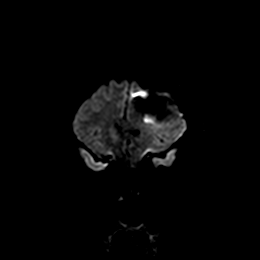
[im 64/64]
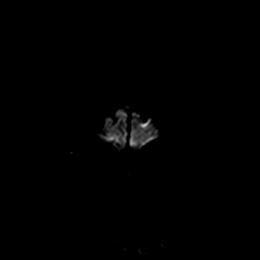

[Series 8: DWI · coronal · 4.0mm · 0.88mm/px · 2 of 32 slices shown (4 of 4)]
[im 1/32]
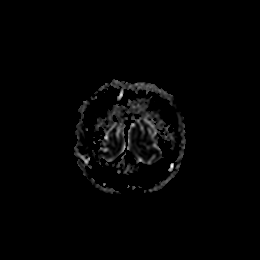
[im 32/32]
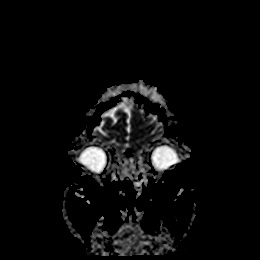

[Series 9: T1 · sagittal · 5.0mm · 0.75mm/px · 2 of 23 slices shown]
[im 1/23]
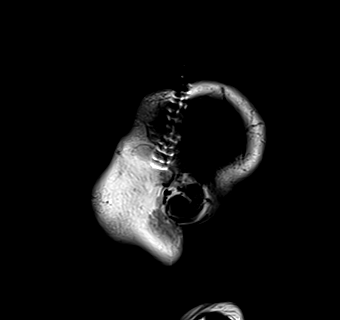
[im 23/23]
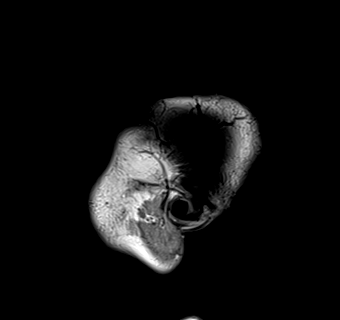

[Series 10: T2 · axial · 5.0mm · 0.72mm/px · z∈[-50,+92]mm · 2 of 25 slices shown]
[im 1/25]
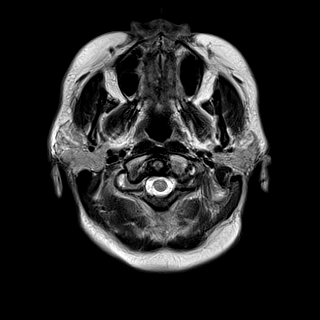
[im 25/25]
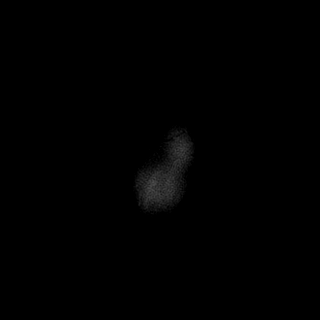

[Series 11: FLAIR · axial · 5.0mm · 0.45mm/px · z∈[-53,+89]mm · 2 of 25 slices shown]
[im 1/25]
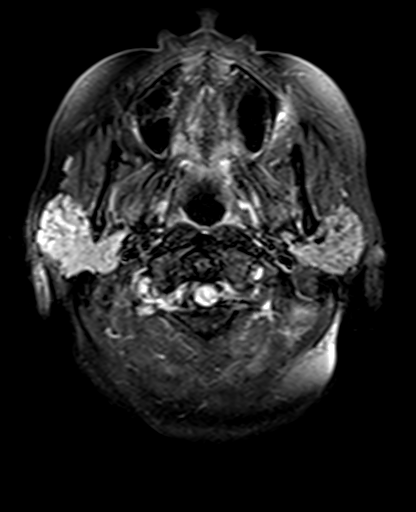
[im 25/25]
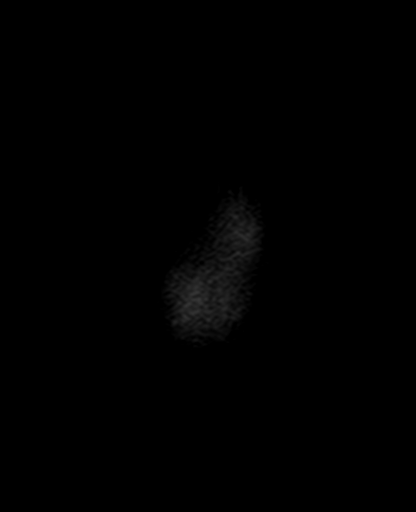

[Series 13: pha_images · axial · 3.0mm · 0.90mm/px · z∈[-70,+96]mm · 4 of 57 slices shown]
[im 1/57]
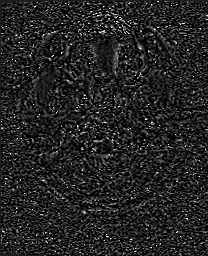
[im 19/57]
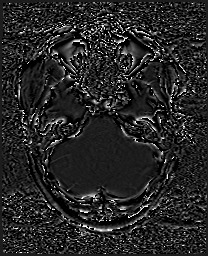
[im 38/57]
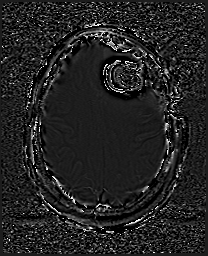
[im 57/57]
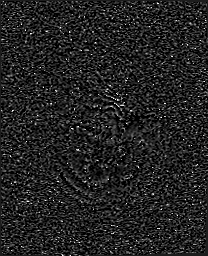

[Series 14: swi_images · axial · 3.0mm · 0.90mm/px · z∈[-70,+105]mm · 5 of 60 slices shown]
[im 1/60]
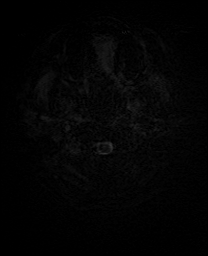
[im 15/60]
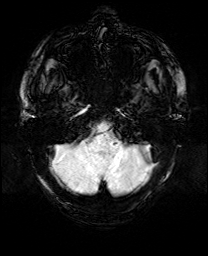
[im 30/60]
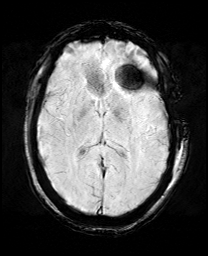
[im 45/60]
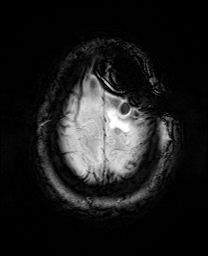
[im 60/60]
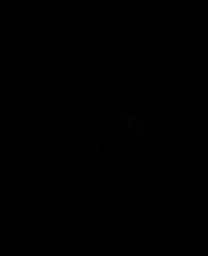

[Series 17: T2 post-contrast · coronal · 5.0mm · 0.72mm/px · 2 of 28 slices shown]
[im 1/28]
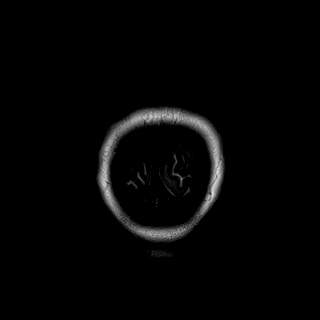
[im 28/28]
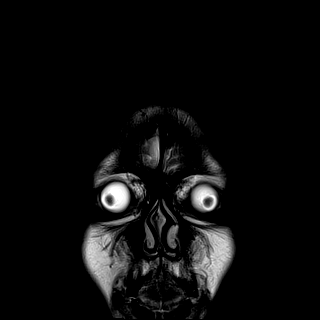

[Series 19: T1 post-contrast · coronal · 5.0mm · 0.34mm/px · 2 of 28 slices shown (1 of 2)]
[im 1/28]
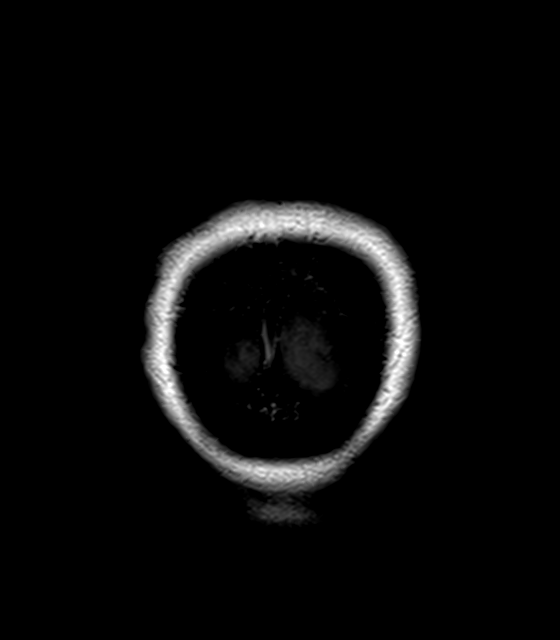
[im 28/28]
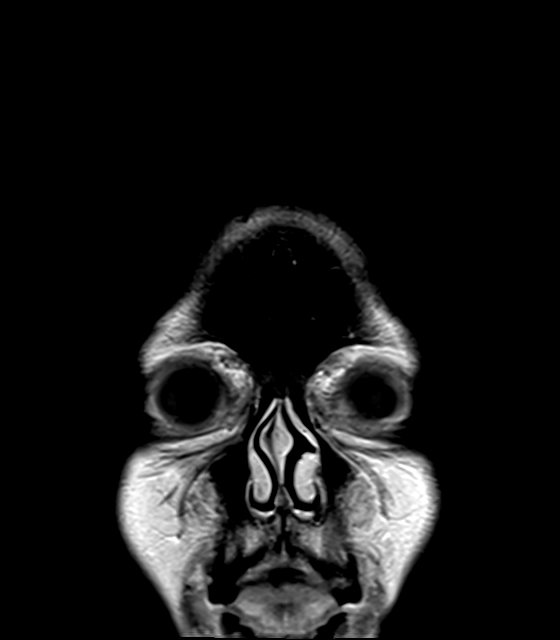

[Series 20: T1 post-contrast · sagittal · 5.0mm · 0.72mm/px · 2 of 23 slices shown (2 of 2)]
[im 1/23]
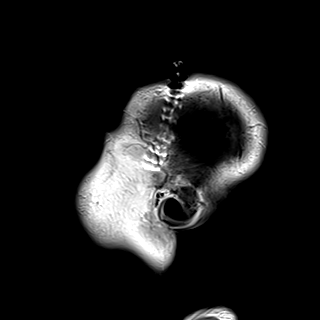
[im 23/23]
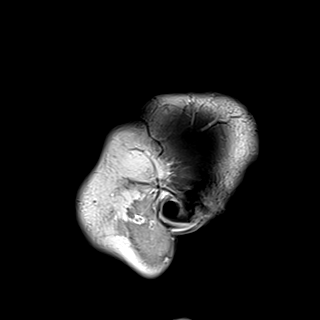

[38 of 48 positions shown; findings below may reference images not displayed]

FINDINGS: Brain: No acute infarct. Status post resection of left frontal
lesion. The area of hyperintense T2-weighted signal within the
surrounding left frontal white matter is unchanged. There is trace
rightward midline shift, improved from the preoperative examination.
There is no residual nodular contrast enhancement at the resection
site. There is mild pneumocephalus.

Vascular: Normal flow voids.

Skull and upper cervical spine: Normal marrow signal.

Sinuses/Orbits: Negative.

Other: None.
IMPRESSION: Status post resection of left frontal lesion. No residual nodular
contrast enhancement at the resection site.

## 2021-10-11 SURGERY — CRANIOTOMY TUMOR EXCISION
Anesthesia: General | Site: Head | Laterality: Left

## 2021-10-11 MED ORDER — PHENYLEPHRINE 40 MCG/ML (10ML) SYRINGE FOR IV PUSH (FOR BLOOD PRESSURE SUPPORT)
PREFILLED_SYRINGE | INTRAVENOUS | Status: AC
  Filled 2021-10-11: qty 10

## 2021-10-11 MED ORDER — CHLORHEXIDINE GLUCONATE CLOTH 2 % EX PADS
6.0000 | MEDICATED_PAD | Freq: Once | CUTANEOUS | Status: AC
  Administered 2021-10-11: 6 via TOPICAL

## 2021-10-11 MED ORDER — PROMETHAZINE HCL 25 MG/ML IJ SOLN: 6.2500 mg | INTRAMUSCULAR | Status: DC | PRN

## 2021-10-11 MED ORDER — LIDOCAINE-EPINEPHRINE 1 %-1:100000 IJ SOLN
INTRAMUSCULAR | Status: DC | PRN
  Administered 2021-10-11: 20 mL

## 2021-10-11 MED ORDER — THROMBIN 20000 UNITS EX SOLR
CUTANEOUS | Status: DC | PRN
  Administered 2021-10-11: 20 mL via TOPICAL

## 2021-10-11 MED ORDER — DEXAMETHASONE SODIUM PHOSPHATE 10 MG/ML IJ SOLN
INTRAMUSCULAR | Status: DC | PRN
  Administered 2021-10-11: 10 mg via INTRAVENOUS

## 2021-10-11 MED ORDER — GADOBUTROL 1 MMOL/ML IV SOLN
8.0000 mL | Freq: Once | INTRAVENOUS | Status: AC | PRN
  Administered 2021-10-11: 8 mL via INTRAVENOUS

## 2021-10-11 MED ORDER — FENTANYL CITRATE (PF) 250 MCG/5ML IJ SOLN
INTRAMUSCULAR | Status: AC
  Filled 2021-10-11: qty 5

## 2021-10-11 MED ORDER — THROMBIN 5000 UNITS EX SOLR
CUTANEOUS | Status: AC
  Filled 2021-10-11: qty 10000

## 2021-10-11 MED ORDER — LIDOCAINE 2% (20 MG/ML) 5 ML SYRINGE
INTRAMUSCULAR | Status: DC | PRN
Start: 2021-10-11 — End: 2021-10-11
  Administered 2021-10-11: 60 mg via INTRAVENOUS

## 2021-10-11 MED ORDER — PHENYLEPHRINE HCL-NACL 20-0.9 MG/250ML-% IV SOLN
INTRAVENOUS | Status: DC | PRN
  Administered 2021-10-11: 25 ug/min via INTRAVENOUS

## 2021-10-11 MED ORDER — LIDOCAINE-EPINEPHRINE 1 %-1:100000 IJ SOLN
INTRAMUSCULAR | Status: AC
  Filled 2021-10-11: qty 1

## 2021-10-11 MED ORDER — HEMOSTATIC AGENTS (NO CHARGE) OPTIME
TOPICAL | Status: DC | PRN
  Administered 2021-10-11: 1 via TOPICAL

## 2021-10-11 MED ORDER — LACTATED RINGERS IV SOLN: INTRAVENOUS | Status: DC

## 2021-10-11 MED ORDER — ORAL CARE MOUTH RINSE: 15.0000 mL | Freq: Once | OROMUCOSAL | Status: AC

## 2021-10-11 MED ORDER — THROMBIN 5000 UNITS EX SOLR
OROMUCOSAL | Status: DC | PRN
  Administered 2021-10-11 (×2): 5 mL via TOPICAL

## 2021-10-11 MED ORDER — FENTANYL CITRATE (PF) 100 MCG/2ML IJ SOLN: 25.0000 ug | INTRAMUSCULAR | Status: DC | PRN

## 2021-10-11 MED ORDER — PROPOFOL 10 MG/ML IV BOLUS
INTRAVENOUS | Status: DC | PRN
  Administered 2021-10-11: 100 mg via INTRAVENOUS
  Administered 2021-10-11: 20 mg via INTRAVENOUS

## 2021-10-11 MED ORDER — THROMBIN 20000 UNITS EX SOLR
CUTANEOUS | Status: AC
  Filled 2021-10-11: qty 20000

## 2021-10-11 MED ORDER — HYDROCODONE-ACETAMINOPHEN 5-325 MG PO TABS
1.0000 | ORAL_TABLET | ORAL | Status: DC | PRN
  Administered 2021-10-11: 1 via ORAL
  Filled 2021-10-11 (×2): qty 1

## 2021-10-11 MED ORDER — LABETALOL HCL 5 MG/ML IV SOLN
10.0000 mg | INTRAVENOUS | Status: DC | PRN
  Administered 2021-10-11: 20 mg via INTRAVENOUS
  Administered 2021-10-11: 30 mg via INTRAVENOUS
  Filled 2021-10-11: qty 8
  Filled 2021-10-11: qty 4

## 2021-10-11 MED ORDER — THROMBIN 5000 UNITS EX SOLR
CUTANEOUS | Status: AC
  Filled 2021-10-11: qty 5000

## 2021-10-11 MED ORDER — SODIUM CHLORIDE 0.9 % IV SOLN: INTRAVENOUS | Status: DC

## 2021-10-11 MED ORDER — ROCURONIUM BROMIDE 10 MG/ML (PF) SYRINGE
PREFILLED_SYRINGE | INTRAVENOUS | Status: DC | PRN
  Administered 2021-10-11: 60 mg via INTRAVENOUS

## 2021-10-11 MED ORDER — ACETAMINOPHEN 650 MG RE SUPP: 650.0000 mg | RECTAL | Status: DC | PRN

## 2021-10-11 MED ORDER — SODIUM CHLORIDE 0.9 % IR SOLN
Status: DC | PRN
  Administered 2021-10-11: 3000 mL

## 2021-10-11 MED ORDER — CEFAZOLIN SODIUM-DEXTROSE 2-4 GM/100ML-% IV SOLN
2.0000 g | Freq: Three times a day (TID) | INTRAVENOUS | Status: AC
  Administered 2021-10-11 – 2021-10-12 (×2): 2 g via INTRAVENOUS
  Filled 2021-10-11 (×2): qty 100

## 2021-10-11 MED ORDER — SODIUM CHLORIDE 0.9 % IV SOLN
INTRAVENOUS | Status: DC | PRN
Start: 2021-10-11 — End: 2021-10-11

## 2021-10-11 MED ORDER — PROPOFOL 10 MG/ML IV BOLUS
INTRAVENOUS | Status: AC
  Filled 2021-10-11: qty 20

## 2021-10-11 MED ORDER — CHLORHEXIDINE GLUCONATE CLOTH 2 % EX PADS
6.0000 | MEDICATED_PAD | Freq: Every day | CUTANEOUS | Status: DC
  Administered 2021-10-11 – 2021-10-12 (×2): 6 via TOPICAL

## 2021-10-11 MED ORDER — SUGAMMADEX SODIUM 200 MG/2ML IV SOLN
INTRAVENOUS | Status: DC | PRN
  Administered 2021-10-11: 200 mg via INTRAVENOUS

## 2021-10-11 MED ORDER — LETROZOLE 2.5 MG PO TABS
2.5000 mg | ORAL_TABLET | Freq: Every morning | ORAL | Status: DC
  Administered 2021-10-12 – 2021-10-13 (×2): 2.5 mg via ORAL
  Filled 2021-10-11 (×2): qty 1

## 2021-10-11 MED ORDER — ONDANSETRON HCL 4 MG PO TABS: 4.0000 mg | ORAL_TABLET | ORAL | Status: DC | PRN

## 2021-10-11 MED ORDER — MEPERIDINE HCL 25 MG/ML IJ SOLN: 6.2500 mg | INTRAMUSCULAR | Status: DC | PRN

## 2021-10-11 MED ORDER — PANTOPRAZOLE SODIUM 40 MG IV SOLR
40.0000 mg | Freq: Every day | INTRAVENOUS | Status: DC
  Administered 2021-10-11: 40 mg via INTRAVENOUS
  Filled 2021-10-11: qty 40

## 2021-10-11 MED ORDER — ONDANSETRON HCL 4 MG/2ML IJ SOLN
INTRAMUSCULAR | Status: DC | PRN
  Administered 2021-10-11: 4 mg via INTRAVENOUS

## 2021-10-11 MED ORDER — ACETAMINOPHEN 325 MG PO TABS: 650.0000 mg | ORAL_TABLET | ORAL | Status: DC | PRN

## 2021-10-11 MED ORDER — BACITRACIN ZINC 500 UNIT/GM EX OINT
TOPICAL_OINTMENT | CUTANEOUS | Status: AC
  Filled 2021-10-11: qty 28.35

## 2021-10-11 MED ORDER — BACITRACIN ZINC 500 UNIT/GM EX OINT
TOPICAL_OINTMENT | CUTANEOUS | Status: DC | PRN
  Administered 2021-10-11: 1 via TOPICAL

## 2021-10-11 MED ORDER — CHLORHEXIDINE GLUCONATE 0.12 % MT SOLN
15.0000 mL | Freq: Once | OROMUCOSAL | Status: AC
  Administered 2021-10-11: 15 mL via OROMUCOSAL
  Filled 2021-10-11: qty 15

## 2021-10-11 MED ORDER — ONDANSETRON HCL 4 MG/2ML IJ SOLN: 4.0000 mg | INTRAMUSCULAR | Status: DC | PRN

## 2021-10-11 MED ORDER — MANNITOL 25 % IV SOLN
INTRAVENOUS | Status: DC | PRN
  Administered 2021-10-11: 60 g via INTRAVENOUS

## 2021-10-11 MED ORDER — ROCURONIUM BROMIDE 10 MG/ML (PF) SYRINGE
PREFILLED_SYRINGE | INTRAVENOUS | Status: AC
  Filled 2021-10-11: qty 10

## 2021-10-11 MED ORDER — FENTANYL CITRATE (PF) 250 MCG/5ML IJ SOLN
INTRAMUSCULAR | Status: DC | PRN
  Administered 2021-10-11: 25 ug via INTRAVENOUS
  Administered 2021-10-11: 50 ug via INTRAVENOUS
  Administered 2021-10-11: 25 ug via INTRAVENOUS
  Administered 2021-10-11: 50 ug via INTRAVENOUS

## 2021-10-11 MED ORDER — SODIUM CHLORIDE 0.9 % IV SOLN
0.0125 ug/kg/min | INTRAVENOUS | Status: DC
  Filled 2021-10-11 (×2): qty 2000

## 2021-10-11 MED ORDER — MUPIROCIN 2 % EX OINT
1.0000 "application " | TOPICAL_OINTMENT | Freq: Two times a day (BID) | CUTANEOUS | Status: DC
  Administered 2021-10-11 – 2021-10-12 (×3): 1 via NASAL
  Filled 2021-10-11 (×3): qty 22

## 2021-10-11 MED ORDER — PROMETHAZINE HCL 25 MG PO TABS
12.5000 mg | ORAL_TABLET | ORAL | Status: DC | PRN
  Filled 2021-10-11: qty 1

## 2021-10-11 MED ORDER — CEFAZOLIN SODIUM-DEXTROSE 2-4 GM/100ML-% IV SOLN
2.0000 g | INTRAVENOUS | Status: AC
  Administered 2021-10-11: 2 g via INTRAVENOUS
  Filled 2021-10-11: qty 100

## 2021-10-11 MED ORDER — INSULIN ASPART 100 UNIT/ML IJ SOLN
0.0000 [IU] | INTRAMUSCULAR | Status: DC
  Administered 2021-10-11 – 2021-10-12 (×3): 1 [IU] via SUBCUTANEOUS

## 2021-10-11 MED ORDER — SODIUM CHLORIDE 0.9 % IV SOLN
INTRAVENOUS | Status: DC | PRN
  Administered 2021-10-11: .1 ug/kg/min via INTRAVENOUS

## 2021-10-11 SURGICAL SUPPLY — 67 items
BAG COUNTER SPONGE SURGICOUNT (BAG) ×2 IMPLANT
BAG DECANTER FOR FLEXI CONT (MISCELLANEOUS) ×2 IMPLANT
BAND RUBBER #18 3X1/16 STRL (MISCELLANEOUS) ×2 IMPLANT
BLADE CLIPPER SURG (BLADE) ×2 IMPLANT
BNDG COHESIVE 4X5 TAN STRL (GAUZE/BANDAGES/DRESSINGS) ×2 IMPLANT
BUR ACORN 6.0 PRECISION (BURR) ×2 IMPLANT
BUR SPIRAL ROUTER 2.3 (BUR) ×1 IMPLANT
CANISTER SUCT 3000ML PPV (MISCELLANEOUS) ×4 IMPLANT
CARTRIDGE OIL MAESTRO DRILL (MISCELLANEOUS) ×1 IMPLANT
CLIP VESOCCLUDE MED 6/CT (CLIP) ×2 IMPLANT
CNTNR URN SCR LID CUP LEK RST (MISCELLANEOUS) ×1 IMPLANT
CONT SPEC 4OZ STRL OR WHT (MISCELLANEOUS) ×1
COVER BURR HOLE UNIV 10 (Orthopedic Implant) ×1 IMPLANT
DIFFUSER DRILL AIR PNEUMATIC (MISCELLANEOUS) ×2 IMPLANT
DRAIN CHANNEL 10M FLAT 3/4 FLT (DRAIN) IMPLANT
DRAPE CAMERA VIDEO/LASER (DRAPES) IMPLANT
DRAPE MICROSCOPE LEICA (MISCELLANEOUS) ×2 IMPLANT
DRAPE NEUROLOGICAL W/INCISE (DRAPES) ×2 IMPLANT
DRAPE STERI IOBAN 125X83 (DRAPES) IMPLANT
DRAPE SURG 17X23 STRL (DRAPES) IMPLANT
DRAPE WARM FLUID 44X44 (DRAPES) ×2 IMPLANT
ELECT CAUTERY BLADE 6.4 (BLADE) ×2 IMPLANT
ELECT REM PT RETURN 9FT ADLT (ELECTROSURGICAL) ×2 IMPLANT
ELECTRODE REM PT RTRN 9FT ADLT (ELECTROSURGICAL) ×1 IMPLANT
EVACUATOR SILICONE 100CC (DRAIN) IMPLANT
FORCEPS BIPOLAR SPETZLER 8 1.0 (NEUROSURGERY SUPPLIES) ×1 IMPLANT
GAUZE 4X4 16PLY ~~LOC~~+RFID DBL (SPONGE) ×2 IMPLANT
GAUZE SPONGE 4X4 12PLY STRL (GAUZE/BANDAGES/DRESSINGS) ×2 IMPLANT
GLOVE EXAM NITRILE XL STR (GLOVE) IMPLANT
GLOVE SURG LTX SZ9 (GLOVE) ×2 IMPLANT
GLOVE SURG UNDER POLY LF SZ7 (GLOVE) ×1 IMPLANT
GOWN STRL REUS W/ TWL LRG LVL3 (GOWN DISPOSABLE) IMPLANT
GOWN STRL REUS W/ TWL XL LVL3 (GOWN DISPOSABLE) IMPLANT
GOWN STRL REUS W/TWL 2XL LVL3 (GOWN DISPOSABLE) IMPLANT
GOWN STRL REUS W/TWL LRG LVL3 (GOWN DISPOSABLE) ×2
GOWN STRL REUS W/TWL XL LVL3 (GOWN DISPOSABLE) ×2
HEMOSTAT POWDER KIT SURGIFOAM (HEMOSTASIS) ×2 IMPLANT
HEMOSTAT SURGICEL 2X14 (HEMOSTASIS) ×1 IMPLANT
KIT BASIN OR (CUSTOM PROCEDURE TRAY) ×2 IMPLANT
KIT TURNOVER KIT B (KITS) ×2 IMPLANT
MARKER SPHERE PSV REFLC 13MM (MARKER) ×4 IMPLANT
NDL HYPO 18GX1.5 BLUNT FILL (NEEDLE) IMPLANT
NDL HYPO 25X1 1.5 SAFETY (NEEDLE) ×1 IMPLANT
NEEDLE HYPO 18GX1.5 BLUNT FILL (NEEDLE) IMPLANT
NEEDLE HYPO 25X1 1.5 SAFETY (NEEDLE) ×2 IMPLANT
NS IRRIG 1000ML POUR BTL (IV SOLUTION) ×5 IMPLANT
OIL CARTRIDGE MAESTRO DRILL (MISCELLANEOUS) ×2 IMPLANT
PACK CRANIOTOMY CUSTOM (CUSTOM PROCEDURE TRAY) ×2 IMPLANT
PAD ARMBOARD 7.5X6 YLW CONV (MISCELLANEOUS) ×6 IMPLANT
PATTIES SURGICAL .25X.25 (GAUZE/BANDAGES/DRESSINGS) IMPLANT
PATTIES SURGICAL .5 X.5 (GAUZE/BANDAGES/DRESSINGS) IMPLANT
PATTIES SURGICAL .5 X3 (DISPOSABLE) IMPLANT
PATTIES SURGICAL 1X1 (DISPOSABLE) IMPLANT
PLATE BONE 12 2H TARGET XL (Plate) ×2 IMPLANT
SCREW UNIII AXS SD 1.5X4 (Screw) ×7 IMPLANT
SPONGE NEURO XRAY DETECT 1X3 (DISPOSABLE) IMPLANT
SPONGE SURGIFOAM ABS GEL 100 (HEMOSTASIS) ×3 IMPLANT
STAPLER VISISTAT 35W (STAPLE) ×2 IMPLANT
STOCKINETTE TUBULAR 6 INCH (GAUZE/BANDAGES/DRESSINGS) ×1 IMPLANT
SUT NURALON 4 0 TR CR/8 (SUTURE) ×4 IMPLANT
SUT VIC AB 2-0 CT2 18 VCP726D (SUTURE) ×3 IMPLANT
SYR CONTROL 10ML LL (SYRINGE) ×1 IMPLANT
TOWEL GREEN STERILE (TOWEL DISPOSABLE) ×2 IMPLANT
TOWEL GREEN STERILE FF (TOWEL DISPOSABLE) ×2 IMPLANT
TRAY FOLEY MTR SLVR 16FR STAT (SET/KITS/TRAYS/PACK) ×2 IMPLANT
UNDERPAD 30X36 HEAVY ABSORB (UNDERPADS AND DIAPERS) ×2 IMPLANT
WATER STERILE IRR 1000ML POUR (IV SOLUTION) ×2 IMPLANT

## 2021-10-11 NOTE — Transfer of Care (Signed)
Immediate Anesthesia Transfer of Care Note  Patient: Shannon Hayes  Procedure(s) Performed: Left frontal craniotomy and resection of tumor with Brain Lab (Left: Head) APPLICATION OF CRANIAL NAVIGATION (Left: Head)  Patient Location: PACU  Anesthesia Type:General  Level of Consciousness: awake, alert  and oriented  Airway & Oxygen Therapy: Patient Spontanous Breathing  Post-op Assessment: Report given to RN and Post -op Vital signs reviewed and stable  Post vital signs: Reviewed and stable  Last Vitals:  Vitals Value Taken Time  BP 145/109 10/11/21 1637  Temp    Pulse 83 10/11/21 1643  Resp 15 10/11/21 1643  SpO2 97 % 10/11/21 1643  Vitals shown include unvalidated device data.  Last Pain:  Vitals:   10/11/21 1408  TempSrc:   PainSc: 0-No pain      Patients Stated Pain Goal: 2 (29/03/79 5583)  Complications: No notable events documented.

## 2021-10-11 NOTE — Progress Notes (Signed)
Attempted to see pt for eval today. Pt going for craniotomy for frontal mass this am.  Pt up and walking around room on own. Will defer this eval after speaking to nursing to after the resection. Will need an OT reorder after surgery. Jinger Neighbors, [place] [phone]

## 2021-10-11 NOTE — Brief Op Note (Signed)
10/11/2021  4:31 PM  PATIENT:  Mosie Epstein  51 y.o. female  PRE-OPERATIVE DIAGNOSIS:  Brain tumor  POST-OPERATIVE DIAGNOSIS:  Brain tumor  PROCEDURE:  Procedure(s): Left frontal craniotomy and resection of tumor with Brain Lab (Left) APPLICATION OF CRANIAL NAVIGATION (Left)  SURGEON:  Surgeon(s) and Role:    Earnie Larsson, MD - Primary  PHYSICIAN ASSISTANT:   ASSISTANTSMearl Latin   ANESTHESIA:   general  EBL:  50 mL   BLOOD ADMINISTERED:none  DRAINS: none   LOCAL MEDICATIONS USED:  LIDOCAINE   SPECIMEN:  Source of Specimen:  Left frontal lobe  DISPOSITION OF SPECIMEN:  PATHOLOGY  COUNTS:  YES  TOURNIQUET:  * No tourniquets in log *  DICTATION: .Dragon Dictation  PLAN OF CARE: Admit to inpatient   PATIENT DISPOSITION:  PACU - hemodynamically stable.   Delay start of Pharmacological VTE agent (>24hrs) due to surgical blood loss or risk of bleeding: yes

## 2021-10-11 NOTE — Anesthesia Procedure Notes (Signed)
Arterial Line Insertion Start/End1/27/2023 2:00 PM  Patient location: Pre-op. Preanesthetic checklist: patient identified, IV checked, site marked, risks and benefits discussed, surgical consent, monitors and equipment checked, pre-op evaluation, timeout performed and anesthesia consent Lidocaine 1% used for infiltration Right, radial was placed Catheter size: 20 G Hand hygiene performed  and maximum sterile barriers used   Attempts: 1 Procedure performed without using ultrasound guided technique. Following insertion, dressing applied. Post procedure assessment: normal and unchanged

## 2021-10-11 NOTE — Op Note (Signed)
Date of procedure: 10/11/2021  Date of dictation: Same  Service: Neurosurgery  Preoperative diagnosis: Left frontal tumor  Postoperative diagnosis: Same  Procedure Name: Left frontal craniotomy with stereotactic guidance and resection of left frontal tumor probable metastasis  Surgeon:Rox Mcgriff A.Kaelin Bonelli, M.D.  Asst. Surgeon: Reinaldo Meeker, NP  Anesthesia: General  Indication: 51 year old female with history of breast carcinoma presents with new onset seizure.  Work-up demonstrates evidence of a left frontal lesion with marked surrounding edema.  Lesion appears to be dural based.  Patient presents now for craniotomy and resection of tumor.  Operative note: After induction of anesthesia, patient positioned supine with neck turned toward the right and head fixed in [place] pin headrest.  Patient's scalp was registered into the Grassflat stereotactic system.  The scalp was prepped and draped sterilely.  A curvilinear incision is made just behind the hairline on the left side.  Scalp flap was mobilized anteriorly and held in place with self-retaining retractor.  Using the [place] guidance the trajectory to the tumor was confirmed.  A left craniotomy was then performed using high-speed drill.  Bone flap was elevated.  Dura was incised and flapped anteriorly medially.  The tumor itself was not attached to the dura matter.  The brain surface was grossly irregular and stained.  I confirmed position using the stereotactic system.  I made a cortical incision and excised the tumor.  And working around the tumor there was a large mucinous or necrotic component.  I did take cultures but I felt very low suspicion that an abscess was present.  A gross total resection appeared to be achieved.  Tumor was resected to healthy looking white matter all around.  Bleeding points were controlled using the bipolar cautery.  Specimen was sent to pathology which is pending.  Wound was irrigated and is no evidence of any further  bleeding.  Gelfoam was placed in the resection site and then removed.  The dura was laid over the resection cavity.  Gelfoam was placed over the craniotomy defect.  The skull was reapproximated using plates.  Scalp was reapproximated using 2-0 Vicryl suture the galea and staples at the surface.  No apparent complications.  Patient tolerated the procedure well and she returns to the recovery room postop.

## 2021-10-11 NOTE — Progress Notes (Signed)
PROGRESS NOTE  Shannon Hayes EUM:[id number] DOB: 07-21-1971 DOA: 10/08/2021 PCP: Pcp, No  HPI/Recap of past 24 hours: [name] with hx of breast cancer s/p lumpectomy, XRT, chemo followed by Dr. Marylou Mccoy with [facility] heme/onc, presents to [facility] ER from her office at [facility].   Patient reportedly had a seizure and fell to the ground.  Patient was brought in unconscious.  Patient lives in [place] but works in [place].  Her base hospital is [facility] in [place].  Work-up revealed a 2.8 cm left frontal lobe mass, seen on CT scan and confirmed on MRI brain.  CT head showed large frontal edema.  With regional mass-effect and right midline shift.  Brain MRI demonstrated 2.8 cm left frontal lobe mass with surrounding vasogenic edema.  There is effacement of the ventricle.  With a midline shift.  Neurology and neurosurgery were both consulted.  Patient received a loading dose of IV Decadron and IV Keppra.  Admitted by hospitalist service.  Plan for left frontal craniotomy and resection of tumor on 10/11/2021.   10/11/2021: Patient was seen at bedside.  In good spirits, and ready for her procedure.  Assessment/Plan: Principal Problem:   Frontal mass of brain Active Problems:   Malignant neoplasm of overlapping sites of left breast in female, estrogen receptor positive ([place])  Left breast cancer, estrogen receptor positive Followed at [facility] cancer center. Completed lumpectomy, chemotherapy and radiation.  Newly diagnosed frontal mass of brain with concern for metastatic breast cancer versus meningioma. Seen by neurology and neurosurgery She received a loading dose of IV Decadron and IV Keppra.  Currently on IV Decadron 8 mg twice daily and Keppra 1000 g twice daily, continue. No recurrent seizures, continue seizure precautions. Plan for left frontal craniotomy and resection of tumor on Friday 10/11/2021 by neurosurgery.  Hyperglycemia, likely contributed by IV  steroids Obtain hemoglobin A1c Start insulin sliding scale every 4 hours since NPO.  Resolved mild non-anion gap metabolic acidosis Serum bicarb 21> 26. Continue gentle IV fluid hydration LR 75 cc/h. Repeat BMP in the morning  Obesity BMI 31 Recommend weight loss outpatient with regular physical activity and healthy dieting.      Code Status: Full code  Family Communication: Family member at bedside.  Disposition Plan: Likely will discharge to home once neurosurgery consult.   Consultants: Neurosurgery Neurology Neurooncology, Dr. Mickeal Skinner on 10/10/2021.  Procedures: None  Antimicrobials: None  DVT prophylaxis: Subcu Lovenox daily.  Status is: Inpatient  Patient requires at least 2 midnights for further evaluation and treatment of present condition.      Objective: Vitals:   10/11/21 0035 10/11/21 0422 10/11/21 0741 10/11/21 1208  BP: 111/66 104/69 115/71 118/74  Pulse: (!) 57 (!) 53 86 98  Resp: 15 16 16 16   Temp: 97.6 F (36.4 C) (!) 97.4 F (36.3 C) 98.3 F (36.8 C) 98.7 F (37.1 C)  TempSrc: Oral Oral Oral Oral  SpO2: 100% 97% 100% 97%  Weight:      Height:        Intake/Output Summary (Last 24 hours) at 10/11/2021 1323 Last data filed at 10/11/2021 0700 Gross per 24 hour  Intake 615.83 ml  Output --  Net 615.83 ml   Filed Weights   10/08/21 1559 10/09/21 0421 10/10/21 0637  Weight: 72.6 kg 81.4 kg 81.4 kg    Exam:  General: 51 y.o. year-old female well-developed well-nourished in no acute distress.  She is alert and oriented x3. Cardiovascular: Regular rate and rhythm no rubs or  gallops.  No JVD or thyromegaly noted. Respiratory: Clear to station with no wheezes or rales. Abdomen: Soft monitor normal bowel sounds present. Musculoskeletal: No lower extremity edema bilaterally. Skin: No rashes or ulcerative lesions noted. Psychiatry: Mood is appropriate condition and setting.   Data Reviewed: CBC: Recent Labs  Lab 10/08/21 1627  10/08/21 1634 10/09/21 0313 10/10/21 0712 10/11/21 0244  WBC 5.0  --  5.3 7.4 7.0  NEUTROABS 4.0  --  4.9  --   --   HGB 12.9 12.9 12.7 11.9* 11.9*  HCT 38.8 38.0 37.8 34.4* 36.0  MCV 95.3  --  92.9 91.7 93.3  PLT 197  --  203 206 326   Basic Metabolic Panel: Recent Labs  Lab 10/08/21 1627 10/08/21 1634 10/09/21 0313 10/10/21 0712 10/11/21 0244  NA 137 138 135 140 140  K 4.6 4.3 3.9 4.1 4.1  CL 104 106 107 108 110  CO2 18*  --  21* 26 25  GLUCOSE 128* 136* 140* 126* 151*  BUN 12 15 9 18 14   CREATININE 0.90 0.80 0.83 0.80 0.80  CALCIUM 9.3  --  9.7 9.2 8.8*  MG  --   --  2.0 2.2 2.2  PHOS  --   --   --  3.8 3.2   GFR: Estimated Creatinine Clearance: 85.9 mL/min (by C-G formula based on SCr of 0.8 mg/dL). Liver Function Tests: Recent Labs  Lab 10/08/21 1627 10/09/21 0313  AST 44* 22  ALT 10 11  ALKPHOS 54 65  BILITOT 1.5* 0.5  PROT 6.8 6.9  ALBUMIN 3.7 3.6   No results for input(s): LIPASE, AMYLASE in the last 168 hours. No results for input(s): AMMONIA in the last 168 hours. Coagulation Profile: No results for input(s): INR, PROTIME in the last 168 hours. Cardiac Enzymes: No results for input(s): CKTOTAL, CKMB, CKMBINDEX, TROPONINI in the last 168 hours. BNP (last 3 results) No results for input(s): PROBNP in the last 8760 hours. HbA1C: No results for input(s): HGBA1C in the last 72 hours. CBG: Recent Labs  Lab 10/10/21 1222 10/10/21 1726 10/11/21 0033 10/11/21 0636 10/11/21 1205  GLUCAP 143* 110* 165* 124* 109*   Lipid Profile: No results for input(s): CHOL, HDL, LDLCALC, TRIG, CHOLHDL, LDLDIRECT in the last 72 hours. Thyroid Function Tests: No results for input(s): TSH, T4TOTAL, FREET4, T3FREE, THYROIDAB in the last 72 hours. Anemia Panel: No results for input(s): VITAMINB12, FOLATE, FERRITIN, TIBC, IRON, RETICCTPCT in the last 72 hours. Urine analysis:    Component Value Date/Time   COLORURINE YELLOW 10/09/2021 0515   APPEARANCEUR CLEAR  10/09/2021 0515   LABSPEC 1.017 10/09/2021 0515   PHURINE 7.0 10/09/2021 0515   GLUCOSEU NEGATIVE 10/09/2021 0515   HGBUR NEGATIVE 10/09/2021 0515   BILIRUBINUR NEGATIVE 10/09/2021 0515   KETONESUR NEGATIVE 10/09/2021 0515   PROTEINUR NEGATIVE 10/09/2021 0515   NITRITE NEGATIVE 10/09/2021 0515   LEUKOCYTESUR NEGATIVE 10/09/2021 0515   Sepsis Labs: @LABRCNTIP (procalcitonin:4,lacticidven:4)  ) Recent Results (from the past 240 hour(s))  Resp Panel by RT-PCR (Flu A&B, Covid) Nasopharyngeal Swab     Status: None   Collection Time: 10/08/21  3:51 PM   Specimen: Nasopharyngeal Swab; Nasopharyngeal(NP) swabs in vial transport medium  Result Value Ref Range Status   SARS Coronavirus 2 by RT PCR NEGATIVE NEGATIVE Final    Comment: (NOTE) SARS-CoV-2 target nucleic acids are NOT DETECTED.  The SARS-CoV-2 RNA is generally detectable in upper respiratory specimens during the acute phase of infection. The lowest concentration of SARS-CoV-2 viral copies  this assay can detect is 138 copies/mL. A negative result does not preclude SARS-Cov-2 infection and should not be used as the sole basis for treatment or other patient management decisions. A negative result may occur with  improper specimen collection/handling, submission of specimen other than nasopharyngeal swab, presence of viral mutation(s) within the areas targeted by this assay, and inadequate number of viral copies(<138 copies/mL). A negative result must be combined with clinical observations, patient history, and epidemiological information. The expected result is Negative.  Fact Sheet for Patients:  EntrepreneurPulse.com.au  Fact Sheet for Healthcare Providers:  IncredibleEmployment.be  This test is no t yet approved or cleared by the [place] FDA and  has been authorized for detection and/or diagnosis of SARS-CoV-2 by FDA under an Emergency Use Authorization (EUA). This EUA will remain   in effect (meaning this test can be used) for the duration of the COVID-19 declaration under Section 564(b)(1) of the Act, 21 U.S.C.section 360bbb-3(b)(1), unless the authorization is terminated  or revoked sooner.       Influenza A by PCR NEGATIVE NEGATIVE Final   Influenza B by PCR NEGATIVE NEGATIVE Final    Comment: (NOTE) The Xpert Xpress SARS-CoV-2/FLU/RSV plus assay is intended as an aid in the diagnosis of influenza from Nasopharyngeal swab specimens and should not be used as a sole basis for treatment. Nasal washings and aspirates are unacceptable for Xpert Xpress SARS-CoV-2/FLU/RSV testing.  Fact Sheet for Patients: EntrepreneurPulse.com.au  Fact Sheet for Healthcare Providers: IncredibleEmployment.be  This test is not yet approved or cleared by the [place] FDA and has been authorized for detection and/or diagnosis of SARS-CoV-2 by FDA under an Emergency Use Authorization (EUA). This EUA will remain in effect (meaning this test can be used) for the duration of the COVID-19 declaration under Section 564(b)(1) of the Act, 21 U.S.C. section 360bbb-3(b)(1), unless the authorization is terminated or revoked.  Performed at [facility], [place] [address], [place], [place] [address]   Surgical PCR screen     Status: None   Collection Time: 10/11/21  9:26 AM   Specimen: Nasal Mucosa; Nasal Swab  Result Value Ref Range Status   MRSA, PCR NEGATIVE NEGATIVE Final   Staphylococcus aureus NEGATIVE NEGATIVE Final    Comment: (NOTE) The Xpert SA Assay (FDA approved for NASAL specimens in patients 9 years of age and older), is one component of a comprehensive surveillance program. It is not intended to diagnose infection nor to guide or monitor treatment. Performed at [facility], [place] [address], [place], [place] [address]       Studies: No results found.  Scheduled Meds:  dexamethasone (DECADRON) injection  8 mg  Intravenous Q12H   insulin aspart  0-9 Units Subcutaneous Q4H   mupirocin ointment  1 application Nasal BID    Continuous Infusions:   ceFAZolin (ANCEF) IV     lactated ringers 75 mL/hr at 10/11/21 0727   levETIRAcetam 1,000 mg (10/11/21 0826)     LOS: 3 days     Kayleen Memos, MD [facility] Pager [phone]  If 7PM-7AM, please contact night-coverage [email or web address] Password [email or web address] 10/11/2021, 1:23 PM

## 2021-10-11 NOTE — Anesthesia Preprocedure Evaluation (Signed)
Anesthesia Evaluation  Patient identified by MRN, date of birth, ID band Patient awake    Reviewed: Allergy & Precautions, H&P , NPO status , Patient's Chart, lab work & pertinent test results  Airway Mallampati: III  TM Distance: >3 FB Neck ROM: Full    Dental no notable dental hx. (+) Teeth Intact, Dental Advisory Given   Pulmonary neg pulmonary ROS,    Pulmonary exam normal breath sounds clear to auscultation       Cardiovascular negative cardio ROS   Rhythm:Regular Rate:Normal     Neuro/Psych negative neurological ROS  negative psych ROS   GI/Hepatic negative GI ROS, Neg liver ROS,   Endo/Other  negative endocrine ROS  Renal/GU negative Renal ROS  negative genitourinary   Musculoskeletal   Abdominal   Peds  Hematology negative hematology ROS (+)   Anesthesia Other Findings   Reproductive/Obstetrics negative OB ROS                             Anesthesia Physical Anesthesia Plan  ASA: 3  Anesthesia Plan: General   Post-op Pain Management: Ofirmev IV (intra-op)   Induction: Intravenous  PONV Risk Score and Plan: 4 or greater and Ondansetron, Dexamethasone and Treatment may vary due to age or medical condition  Airway Management Planned: Oral ETT  Additional Equipment: Arterial line  Intra-op Plan:   Post-operative Plan: Extubation in OR and Possible Post-op intubation/ventilation  Informed Consent: I have reviewed the patients History and Physical, chart, labs and discussed the procedure including the risks, benefits and alternatives for the proposed anesthesia with the patient or authorized representative who has indicated his/her understanding and acceptance.   Patient has DNR.  Discussed DNR with patient and Continue DNR.   Dental advisory given  Plan Discussed with: CRNA  Anesthesia Plan Comments:         Anesthesia Quick Evaluation

## 2021-10-11 NOTE — Interval H&P Note (Signed)
History and Physical Interval Note:  10/11/2021 1:44 PM  Shannon Hayes  has presented today for surgery, with the diagnosis of Brain tumor.  The various methods of treatment have been discussed with the patient and family. After consideration of risks, benefits and other options for treatment, the patient has consented to  Procedure(s): Left frontal craniotomy and resection of tumor with Brain Lab (Left) APPLICATION OF CRANIAL NAVIGATION (Left) as a surgical intervention.  The patient's history has been reviewed, patient examined, no change in status, stable for surgery.  I have reviewed the patient's chart and labs.  Questions were answered to the patient's satisfaction.     Cooper Render Shamir Tuzzolino

## 2021-10-11 NOTE — Anesthesia Procedure Notes (Signed)
Procedure Name: Intubation Date/Time: 10/11/2021 2:53 PM Performed by: Clearnce Sorrel, CRNA Pre-anesthesia Checklist: Patient identified, Emergency Drugs available, Suction available and Patient being monitored Patient Re-evaluated:Patient Re-evaluated prior to induction Oxygen Delivery Method: Circle System Utilized Preoxygenation: Pre-oxygenation with 100% oxygen Induction Type: IV induction Ventilation: Mask ventilation without difficulty Laryngoscope Size: Glidescope and 3 Grade View: Grade II Tube type: Oral Tube size: 7.0 mm Number of attempts: 1 Airway Equipment and Method: Stylet and Oral airway Placement Confirmation: ETT inserted through vocal cords under direct vision, positive ETCO2 and breath sounds checked- equal and bilateral Secured at: 22 cm Tube secured with: Tape Dental Injury: Teeth and Oropharynx as per pre-operative assessment

## 2021-10-12 LAB — GLUCOSE, CAPILLARY
Glucose-Capillary: 126 mg/dL — ABNORMAL HIGH (ref 70–99)
Glucose-Capillary: 114 mg/dL — ABNORMAL HIGH (ref 70–99)
Glucose-Capillary: 146 mg/dL — ABNORMAL HIGH (ref 70–99)
Glucose-Capillary: 131 mg/dL — ABNORMAL HIGH (ref 70–99)
Glucose-Capillary: 140 mg/dL — ABNORMAL HIGH (ref 70–99)

## 2021-10-12 LAB — BASIC METABOLIC PANEL
Anion gap: 8 (ref 5–15)
BUN: 10 mg/dL (ref 6–20)
CO2: 24 mmol/L (ref 22–32)
Calcium: 8.7 mg/dL — ABNORMAL LOW (ref 8.9–10.3)
Chloride: 108 mmol/L (ref 98–111)
Creatinine, Ser: 0.72 mg/dL (ref 0.44–1.00)
GFR, Estimated: >60 mL/min (ref >60)
Glucose, Bld: 121 mg/dL — ABNORMAL HIGH (ref 70–99)
Potassium: 3.7 mmol/L (ref 3.5–5.1)
Sodium: 140 mmol/L (ref 135–145)

## 2021-10-12 LAB — HEMOGLOBIN A1C
Hgb A1c MFr Bld: 4.7 % — ABNORMAL LOW (ref 4.8–5.6)
Mean Plasma Glucose: 88.19 mg/dL

## 2021-10-12 LAB — CBC
HCT: 34.4 % — ABNORMAL LOW (ref 36.0–46.0)
Hemoglobin: 12.1 g/dL (ref 12.0–15.0)
MCH: 31.9 pg (ref 26.0–34.0)
MCHC: 35.2 g/dL (ref 30.0–36.0)
MCV: 90.8 fL (ref 80.0–100.0)
Platelets: 192 10*3/uL (ref 150–400)
RBC: 3.79 MIL/uL — ABNORMAL LOW (ref 3.87–5.11)
RDW: 12.4 % (ref 11.5–15.5)
WBC: 12.1 10*3/uL — ABNORMAL HIGH (ref 4.0–10.5)
nRBC: 0 % (ref 0.0–0.2)

## 2021-10-12 LAB — MAGNESIUM: Magnesium: 2 mg/dL (ref 1.7–2.4)

## 2021-10-12 LAB — PHOSPHORUS: Phosphorus: 4.4 mg/dL (ref 2.5–4.6)

## 2021-10-12 MED ORDER — LEVETIRACETAM 500 MG PO TABS
1000.0000 mg | ORAL_TABLET | Freq: Two times a day (BID) | ORAL | Status: DC
  Administered 2021-10-12 – 2021-10-13 (×2): 1000 mg via ORAL
  Filled 2021-10-12 (×2): qty 2

## 2021-10-12 MED ORDER — DEXAMETHASONE 4 MG PO TABS
4.0000 mg | ORAL_TABLET | Freq: Four times a day (QID) | ORAL | Status: DC
  Administered 2021-10-12 – 2021-10-13 (×5): 4 mg via ORAL
  Filled 2021-10-12 (×5): qty 1

## 2021-10-12 MED ORDER — PANTOPRAZOLE SODIUM 40 MG PO TBEC
40.0000 mg | DELAYED_RELEASE_TABLET | Freq: Every day | ORAL | Status: DC
  Administered 2021-10-12 – 2021-10-13 (×2): 40 mg via ORAL
  Filled 2021-10-12 (×2): qty 1

## 2021-10-12 MED ORDER — SODIUM CHLORIDE 0.9 % IV SOLN: INTRAVENOUS | Status: DC

## 2021-10-12 NOTE — Evaluation (Signed)
Physical Therapy Evaluation & Discharge Patient Details Name: Shannon Hayes MRN: [id number] DOB: 1971/03/07 Today's Date: 10/12/2021  History of Present Illness  51 y/o female presented to ED on 10/08/21 following witnessed seizure. MRI showed 2.8 cm L frontal lobe mass favoring a metastatic lesion. S/p L frontal craniotomy with tumor resection on 1/27. PMH: breast cancer  Clinical Impression  Patient admitted with above diagnosis and procedure. Patient functioning at modI level for mobility with no AD. Patient negotiated flight of stairs in preparation to navigate home environment. Patient with mild balance deficits but suspect due to first time being up since surgery. Patient has support from sister at discharge. No further skilled PT needs required acutely. No PT follow up recommended at this time.        Recommendations for follow up therapy are one component of a multi-disciplinary discharge planning process, led by the attending physician.  Recommendations may be updated based on patient status, additional functional criteria and insurance authorization.  Follow Up Recommendations No PT follow up    Assistance Recommended at Discharge Intermittent Supervision/Assistance  Patient can return home with the following       Equipment Recommendations None recommended by PT  Recommendations for Other Services       Functional Status Assessment Patient has not had a recent decline in their functional status     Precautions / Restrictions Precautions Precautions: Fall Restrictions Weight Bearing Restrictions: No      Mobility  Bed Mobility Overal bed mobility: Modified Independent                  Transfers Overall transfer level: Modified independent Equipment used: None                    Ambulation/Gait Ambulation/Gait assistance: Modified independent (Device/Increase time) Gait Distance (Feet): 300 Feet Assistive device: None Gait Pattern/deviations:  WFL(Within Functional Limits)   Gait velocity interpretation: >2.62 ft/sec, indicative of community ambulatory      Stairs Stairs: Yes Stairs assistance: Modified independent (Device/Increase time) Stair Management: One rail Right, Alternating pattern, Forwards Number of Stairs: 10    Wheelchair Mobility    Modified Rankin (Stroke Patients Only)       Balance Overall balance assessment: Mild deficits observed, not formally tested                                           Pertinent Vitals/Pain Pain Assessment Pain Assessment: No/denies pain    Home Living Family/patient expects to be discharged to:: Private residence Living Arrangements: Other relatives (sister) Available Help at Discharge: Family;Available 24 hours/day Type of Home: House (townhouse) Home Access: Stairs to enter Entrance Stairs-Rails: Right Entrance Stairs-Number of Steps: 2 Alternate Level Stairs-Number of Steps: 12 Home Layout: Two level;Bed/bath upstairs Home Equipment: None      Prior Function Prior Level of Function : Independent/Modified Independent;Working/employed;Driving                     Hand Dominance        Extremity/Trunk Assessment   Upper Extremity Assessment Upper Extremity Assessment: Overall WFL for tasks assessed    Lower Extremity Assessment Lower Extremity Assessment: Overall WFL for tasks assessed    Cervical / Trunk Assessment Cervical / Trunk Assessment: Normal  Communication   Communication: No difficulties  Cognition Arousal/Alertness: Awake/alert Behavior During Therapy: WFL for tasks assessed/performed,  Impulsive Overall Cognitive Status: Impaired/Different from baseline Area of Impairment: Attention, Safety/judgement, Awareness                   Current Attention Level: Selective     Safety/Judgement: Decreased awareness of safety Awareness: Emergent   General Comments: impulsive at times requiring cues for  safety as patient continued to get up after therapist instructed to remain sitting        General Comments      Exercises     Assessment/Plan    PT Assessment Patient does not need any further PT services  PT Problem List         PT Treatment Interventions      PT Goals (Current goals can be found in the Care Plan section)  Acute Rehab PT Goals Patient Stated Goal: to go home PT Goal Formulation: All assessment and education complete, DC therapy    Frequency       Co-evaluation               AM-PAC PT "6 Clicks" Mobility  Outcome Measure Help needed turning from your back to your side while in a flat bed without using bedrails?: None Help needed moving from lying on your back to sitting on the side of a flat bed without using bedrails?: None Help needed moving to and from a bed to a chair (including a wheelchair)?: None Help needed standing up from a chair using your arms (e.g., wheelchair or bedside chair)?: None Help needed to walk in hospital room?: None Help needed climbing 3-5 steps with a railing? : None 6 Click Score: 24    End of Session Equipment Utilized During Treatment: Gait belt Activity Tolerance: Patient tolerated treatment well Patient left: in bed;with call bell/phone within reach;with family/visitor present Nurse Communication: Mobility status PT Visit Diagnosis: Unsteadiness on feet (R26.81)    Time: 2229-7989 PT Time Calculation (min) (ACUTE ONLY): 19 min   Charges:   PT Evaluation $PT Eval Moderate Complexity: 1 Mod          Kyndal Heringer A. Gilford Rile PT, DPT Acute Rehabilitation Services Pager [phone] Office [phone]   Linna Hoff 10/12/2021, 9:14 AM

## 2021-10-12 NOTE — Progress Notes (Signed)
Subjective: NAEs o/n.  No significant HA  Objective: Vital signs in last 24 hours: Temp:  [97.6 F (36.4 C)-99.2 F (37.3 C)] 99.1 F (37.3 C) (01/28 0800) Pulse Rate:  [56-98] 61 (01/28 0800) Resp:  [10-24] 14 (01/28 0800) BP: (108-152)/(74-109) 126/81 (01/28 0800) SpO2:  [96 %-100 %] 98 % (01/28 0800) Arterial Line BP: (124-195)/(67-111) 153/77 (01/28 0800) Weight:  [81 kg] 81 kg (01/28 0500)  Intake/Output from previous day: 01/27 0701 - 01/28 0700 In: 2535.5 [I.V.:2235.5; IV Piggyback:300] Out: 2170 [Urine:2120; Blood:50] Intake/Output this shift: Total I/O In: 112.4 [I.V.:112.4] Out: 300 [Urine:300]  Awake, alert, Ox3 FC x 4, no drift Dressing in place  Lab Results: Recent Labs    10/11/21 0244 10/12/21 0452  WBC 7.0 12.1*  HGB 11.9* 12.1  HCT 36.0 34.4*  PLT 200 192   BMET Recent Labs    10/11/21 0244 10/12/21 0452  NA 140 140  K 4.1 3.7  CL 110 108  CO2 25 24  GLUCOSE 151* 121*  BUN 14 10  CREATININE 0.80 0.72  CALCIUM 8.8* 8.7*    Studies/Results: MR BRAIN W WO CONTRAST  Result Date: 10/11/2021 CLINICAL DATA:  Surveillance of intracranial neoplasm EXAM: MRI HEAD WITHOUT AND WITH CONTRAST TECHNIQUE: Multiplanar, multiecho pulse sequences of the brain and surrounding structures were obtained without and with intravenous contrast. CONTRAST:  61mL GADAVIST GADOBUTROL 1 MMOL/ML IV SOLN COMPARISON:  10/09/2021 FINDINGS: Brain: No acute infarct. Status post resection of left frontal lesion. The area of hyperintense T2-weighted signal within the surrounding left frontal white matter is unchanged. There is trace rightward midline shift, improved from the preoperative examination. There is no residual nodular contrast enhancement at the resection site. There is mild pneumocephalus. Vascular: Normal flow voids. Skull and upper cervical spine: Normal marrow signal. Sinuses/Orbits: Negative. Other: None. IMPRESSION: Status post resection of left frontal lesion. No  residual nodular contrast enhancement at the resection site. Electronically Signed   By: Ulyses Jarred M.D.   On: 10/11/2021 23:13    Assessment/Plan: S/p resection of frontal lobe mass - downgrade today - dex taper - cont Keppra  Vallarie Mare 10/12/2021, 10:20 AM

## 2021-10-12 NOTE — Anesthesia Postprocedure Evaluation (Signed)
Anesthesia Post Note  Patient: Shannon Hayes  Procedure(s) Performed: Left frontal craniotomy and resection of tumor with Brain Lab (Left: Head) APPLICATION OF CRANIAL NAVIGATION (Left: Head)     Patient location during evaluation: PACU Anesthesia Type: General Level of consciousness: sedated and patient cooperative Pain management: pain level controlled Vital Signs Assessment: post-procedure vital signs reviewed and stable Respiratory status: spontaneous breathing Cardiovascular status: stable Anesthetic complications: no   No notable events documented.  Last Vitals:  Vitals:   10/12/21 0500 10/12/21 0600  BP: 119/82 134/78  Pulse: 60 (!) 56  Resp: 17 18  Temp:    SpO2: 98% 98%    Last Pain:  Vitals:   10/12/21 0600  TempSrc:   PainSc: [place]

## 2021-10-12 NOTE — Progress Notes (Signed)
OT Cancellation Note  Patient Details Name: Justyn Boyson MRN: [id number] DOB: 1970-10-11   Cancelled Treatment:    Reason Eval/Treat Not Completed: Patient declined, company recently left, wanting to rest.  Seen earlier by PT.  OT to check back next date.    Rosalyn Archambault D Elazar Argabright 10/12/2021, 3:22 PM

## 2021-10-12 NOTE — Progress Notes (Signed)
Patient arived to 5G25 in NAD, VS stable and patient oriented to room.

## 2021-10-12 NOTE — Progress Notes (Addendum)
PROGRESS NOTE  Shannon Hayes [id number]:[id number] DOB: [birth year] DOA: 10/08/2021 PCP: Pcp, No  HPI/Recap of past 24 hours: [name] with hx of breast cancer s/p lumpectomy, XRT, chemo followed by Dr. Marylou Mccoy with [facility] heme/onc, presents to [facility] ER from her office at [facility].   Patient reportedly had a seizure and fell to the ground.  Patient was brought in unconscious.  Patient lives in [place] but works in [place].  Her base hospital is [facility] in [place].  Work-up revealed a 2.8 cm left frontal lobe mass, seen on CT scan and confirmed on MRI brain.  CT head showed large frontal edema.  With regional mass-effect and right midline shift.  Brain MRI demonstrated 2.8 cm left frontal lobe mass with surrounding vasogenic edema.  There is effacement of the ventricle.  With a midline shift.  Neurology and neurosurgery were both consulted.  Patient received a loading dose of IV Decadron and IV Keppra.  Admitted by hospitalist service.  Post left frontal craniotomy and resection of tumor on 10/11/2021 by Dr. Annette Stable, neurosurgery.   10/12/2021: Patient was seen and examined at bedside.  Admits to 2-3 out of 10 mild headache, no complaints.  She is alert and oriented x4.  Mentation is intact.  Motor strength and sensory are intact.  Assessment/Plan: Principal Problem:   Frontal mass of brain Active Problems:   Malignant neoplasm of overlapping sites of left breast in female, estrogen receptor positive ([place])  Left breast cancer, estrogen receptor positive Followed at [facility] cancer center. Completed lumpectomy, chemotherapy and radiation. Currently on Femara, restarted on 10/12/2021  Newly diagnosed left frontal mass of brain with concern for metastatic breast cancer versus meningioma s/p left frontal craniotomy and resection of tumor on 10/11/2021 by Dr. Annette Stable, neurosurgery. Seen by neurology and neurosurgery She received a loading dose of IV Decadron and IV Keppra.  Currently on  IV Received Decadron 8 mg twice daily and Keppra 1000 g twice daily. Currently on po decadron 4mg  q6h, po keppra 1000mg  BID No recurrent seizures, continue seizure precautions. No acute issues post neurosurgery Follow pathology results.  Hyperglycemia, likely due to IV steroids Hemoglobin A1c 4.7 on 10/04/2021. CBGs levels are improved CBG twice daily  Resolved mild non-anion gap metabolic acidosis Serum bicarb 21> 26>24. Continue gentle IV fluid hydration LR 75 cc/h, reduce rate to 50 cc/h..  Obesity BMI 31 Recommend weight loss outpatient with regular physical activity and healthy dieting.   Critical care time: 65 minutes.   Code Status: Full code  Family Communication: Family member at bedside.  Disposition Plan: Likely will discharge to home once neurosurgery consult.   Consultants: Neurosurgery Neurology Neurooncology, Dr. Mickeal Skinner on 10/10/2021.  Procedures: None  Antimicrobials: None  DVT prophylaxis: Subcu Lovenox daily.  Status is: Inpatient  Patient requires at least 2 midnights for further evaluation and treatment of present condition.      Objective: Vitals:   10/12/21 0400 10/12/21 0500 10/12/21 0600 10/12/21 0800  BP: 134/76 119/82 134/78 126/81  Pulse: 60 60 (!) 56 61  Resp: 19 17 18 14   Temp: 98.7 F (37.1 C)   99.1 F (37.3 C)  TempSrc: Oral   Oral  SpO2: 97% 98% 98% 98%  Weight:  81 kg    Height:        Intake/Output Summary (Last 24 hours) at 10/12/2021 1009 Last data filed at 10/12/2021 0800 Gross per 24 hour  Intake 2647.95 ml  Output 2470 ml  Net 177.95 ml   Danley Danker  Weights   10/09/21 0421 10/10/21 0637 10/12/21 0500  Weight: 81.4 kg 81.4 kg 81 kg    Exam:  General: 51 y.o. year-old female well-developed well-nourished in no acute distress.  She is alert and oriented x4.   Cardiovascular: Regular rate and rhythm no rubs or gallops.   Respiratory: Clear to auscultation no wheezes or rales. Abdomen: Soft monitor normal  bowel sounds present. Musculoskeletal: No lower extremity edema bilaterally. Skin: No rashes or ulcerative lesions noted. Psychiatry: Mood is appropriate for condition and setting.   Data Reviewed: CBC: Recent Labs  Lab 10/08/21 1627 10/08/21 1634 10/09/21 0313 10/10/21 0712 10/11/21 0244 10/12/21 0452  WBC 5.0  --  5.3 7.4 7.0 12.1*  NEUTROABS 4.0  --  4.9  --   --   --   HGB 12.9 12.9 12.7 11.9* 11.9* 12.1  HCT 38.8 38.0 37.8 34.4* 36.0 34.4*  MCV 95.3  --  92.9 91.7 93.3 90.8  PLT 197  --  203 206 200 470   Basic Metabolic Panel: Recent Labs  Lab 10/08/21 1627 10/08/21 1634 10/09/21 0313 10/10/21 0712 10/11/21 0244 10/12/21 0452  NA 137 138 135 140 140 140  K 4.6 4.3 3.9 4.1 4.1 3.7  CL 104 106 107 108 110 108  CO2 18*  --  21* 26 25 24   GLUCOSE 128* 136* 140* 126* 151* 121*  BUN 12 15 9 18 14 10   CREATININE 0.90 0.80 0.83 0.80 0.80 0.72  CALCIUM 9.3  --  9.7 9.2 8.8* 8.7*  MG  --   --  2.0 2.2 2.2 2.0  PHOS  --   --   --  3.8 3.2 4.4   GFR: Estimated Creatinine Clearance: 85.7 mL/min (by C-G formula based on SCr of 0.72 mg/dL). Liver Function Tests: Recent Labs  Lab 10/08/21 1627 10/09/21 0313  AST 44* 22  ALT 10 11  ALKPHOS 54 65  BILITOT 1.5* 0.5  PROT 6.8 6.9  ALBUMIN 3.7 3.6   No results for input(s): LIPASE, AMYLASE in the last 168 hours. No results for input(s): AMMONIA in the last 168 hours. Coagulation Profile: No results for input(s): INR, PROTIME in the last 168 hours. Cardiac Enzymes: No results for input(s): CKTOTAL, CKMB, CKMBINDEX, TROPONINI in the last 168 hours. BNP (last 3 results) No results for input(s): PROBNP in the last 8760 hours. HbA1C: Recent Labs    10/12/21 0452  HGBA1C 4.7*   CBG: Recent Labs  Lab 10/11/21 1205 10/11/21 1918 10/11/21 2315 10/12/21 0309 10/12/21 0744  GLUCAP 109* 116* 131* 126* 114*   Lipid Profile: No results for input(s): CHOL, HDL, LDLCALC, TRIG, CHOLHDL, LDLDIRECT in the last 72  hours. Thyroid Function Tests: No results for input(s): TSH, T4TOTAL, FREET4, T3FREE, THYROIDAB in the last 72 hours. Anemia Panel: No results for input(s): VITAMINB12, FOLATE, FERRITIN, TIBC, IRON, RETICCTPCT in the last 72 hours. Urine analysis:    Component Value Date/Time   COLORURINE YELLOW 10/09/2021 0515   APPEARANCEUR CLEAR 10/09/2021 0515   LABSPEC 1.017 10/09/2021 0515   PHURINE 7.0 10/09/2021 0515   GLUCOSEU NEGATIVE 10/09/2021 0515   HGBUR NEGATIVE 10/09/2021 0515   BILIRUBINUR NEGATIVE 10/09/2021 0515   KETONESUR NEGATIVE 10/09/2021 0515   PROTEINUR NEGATIVE 10/09/2021 0515   NITRITE NEGATIVE 10/09/2021 0515   LEUKOCYTESUR NEGATIVE 10/09/2021 0515   Sepsis Labs: @LABRCNTIP (procalcitonin:4,lacticidven:4)  ) Recent Results (from the past 240 hour(s))  Resp Panel by RT-PCR (Flu A&B, Covid) Nasopharyngeal Swab     Status: None  Collection Time: 10/08/21  3:51 PM   Specimen: Nasopharyngeal Swab; Nasopharyngeal(NP) swabs in vial transport medium  Result Value Ref Range Status   SARS Coronavirus 2 by RT PCR NEGATIVE NEGATIVE Final    Comment: (NOTE) SARS-CoV-2 target nucleic acids are NOT DETECTED.  The SARS-CoV-2 RNA is generally detectable in upper respiratory specimens during the acute phase of infection. The lowest concentration of SARS-CoV-2 viral copies this assay can detect is 138 copies/mL. A negative result does not preclude SARS-Cov-2 infection and should not be used as the sole basis for treatment or other patient management decisions. A negative result may occur with  improper specimen collection/handling, submission of specimen other than nasopharyngeal swab, presence of viral mutation(s) within the areas targeted by this assay, and inadequate number of viral copies(<138 copies/mL). A negative result must be combined with clinical observations, patient history, and epidemiological information. The expected result is Negative.  Fact Sheet for Patients:   EntrepreneurPulse.com.au  Fact Sheet for Healthcare Providers:  IncredibleEmployment.be  This test is no t yet approved or cleared by the [place] FDA and  has been authorized for detection and/or diagnosis of SARS-CoV-2 by FDA under an Emergency Use Authorization (EUA). This EUA will remain  in effect (meaning this test can be used) for the duration of the COVID-19 declaration under Section 564(b)(1) of the Act, 21 U.S.C.section 360bbb-3(b)(1), unless the authorization is terminated  or revoked sooner.       Influenza A by PCR NEGATIVE NEGATIVE Final   Influenza B by PCR NEGATIVE NEGATIVE Final    Comment: (NOTE) The Xpert Xpress SARS-CoV-2/FLU/RSV plus assay is intended as an aid in the diagnosis of influenza from Nasopharyngeal swab specimens and should not be used as a sole basis for treatment. Nasal washings and aspirates are unacceptable for Xpert Xpress SARS-CoV-2/FLU/RSV testing.  Fact Sheet for Patients: EntrepreneurPulse.com.au  Fact Sheet for Healthcare Providers: IncredibleEmployment.be  This test is not yet approved or cleared by the [place] FDA and has been authorized for detection and/or diagnosis of SARS-CoV-2 by FDA under an Emergency Use Authorization (EUA). This EUA will remain in effect (meaning this test can be used) for the duration of the COVID-19 declaration under Section 564(b)(1) of the Act, 21 U.S.C. section 360bbb-3(b)(1), unless the authorization is terminated or revoked.  Performed at [facility], [place] [address] [place], [place] [address]   Surgical PCR screen     Status: None   Collection Time: 10/11/21  9:26 AM   Specimen: Nasal Mucosa; Nasal Swab  Result Value Ref Range Status   MRSA, PCR NEGATIVE NEGATIVE Final   Staphylococcus aureus NEGATIVE NEGATIVE Final    Comment: (NOTE) The Xpert SA Assay (FDA approved for NASAL specimens in patients  3 years of age and older), is one component of a comprehensive surveillance program. It is not intended to diagnose infection nor to guide or monitor treatment. Performed at [facility], [place] [address], [place], [place] [address]   Aerobic/Anaerobic Culture w Gram Stain (surgical/deep wound)     Status: None (Preliminary result)   Collection Time: 10/11/21  3:41 PM   Specimen: PATH Other; Tissue  Result Value Ref Range Status   Specimen Description TISSUE  Final   Special Requests LEFT FRONTAL BRAIN TUMOR  Final   Gram Stain   Final    RARE WBC PRESENT, PREDOMINANTLY MONONUCLEAR FEW GRAM POSITIVE COCCI    Culture   Final    NO GROWTH < 24 HOURS Performed at  [facility], [place] [address] [place], [place] [address]    Report Status PENDING  Incomplete  MRSA Next Gen by PCR, Nasal     Status: None   Collection Time: 10/11/21  6:44 PM   Specimen: Nasal Mucosa; Nasal Swab  Result Value Ref Range Status   MRSA by PCR Next Gen NOT DETECTED NOT DETECTED Final    Comment: (NOTE) The GeneXpert MRSA Assay (FDA approved for NASAL specimens only), is one component of a comprehensive MRSA colonization surveillance program. It is not intended to diagnose MRSA infection nor to guide or monitor treatment for MRSA infections. Test performance is not FDA approved in patients less than 70 years old. Performed at [facility], [place] [address] [place], [place] [address]       Studies: MR BRAIN W WO CONTRAST  Result Date: 10/11/2021 CLINICAL DATA:  Surveillance of intracranial neoplasm EXAM: MRI HEAD WITHOUT AND WITH CONTRAST TECHNIQUE: Multiplanar, multiecho pulse sequences of the brain and surrounding structures were obtained without and with intravenous contrast. CONTRAST:  35mL GADAVIST GADOBUTROL 1 MMOL/ML IV SOLN COMPARISON:  10/09/2021 FINDINGS: Brain: No acute infarct. Status post resection of left frontal lesion. The area of hyperintense T2-weighted signal within the  surrounding left frontal white matter is unchanged. There is trace rightward midline shift, improved from the preoperative examination. There is no residual nodular contrast enhancement at the resection site. There is mild pneumocephalus. Vascular: Normal flow voids. Skull and upper cervical spine: Normal marrow signal. Sinuses/Orbits: Negative. Other: None. IMPRESSION: Status post resection of left frontal lesion. No residual nodular contrast enhancement at the resection site. Electronically Signed   By: Ulyses Jarred M.D.   On: 10/11/2021 23:13    Scheduled Meds:  Chlorhexidine Gluconate Cloth  6 each Topical Daily   dexamethasone (DECADRON) injection  8 mg Intravenous Q12H   insulin aspart  0-9 Units Subcutaneous Q4H   letrozole  2.5 mg Oral q morning   mupirocin ointment  1 application Nasal BID   pantoprazole (PROTONIX) IV  40 mg Intravenous QHS    Continuous Infusions:  sodium chloride 75 mL/hr at 10/12/21 0600   lactated ringers Stopped (10/11/21 1311)   levETIRAcetam 1,000 mg (10/12/21 0807)   remifentanil (ULTIVA) 2 mg in 100 mL normal saline (20 mcg/mL) Optime       LOS: 4 days     Kayleen Memos, MD [facility] Pager [phone]  If 7PM-7AM, please contact night-coverage [email or web address] Password [email or web address] 10/12/2021, 10:09 AM

## 2021-10-13 LAB — GLUCOSE, CAPILLARY
Glucose-Capillary: 117 mg/dL — ABNORMAL HIGH (ref 70–99)
Glucose-Capillary: 134 mg/dL — ABNORMAL HIGH (ref 70–99)
Glucose-Capillary: 142 mg/dL — ABNORMAL HIGH (ref 70–99)

## 2021-10-13 MED ORDER — DEXAMETHASONE 1 MG PO TABS: ORAL_TABLET | ORAL | 0 refills | Status: AC

## 2021-10-13 MED ORDER — PANTOPRAZOLE SODIUM 40 MG PO TBEC: 40.0000 mg | DELAYED_RELEASE_TABLET | Freq: Every day | ORAL | 0 refills | Status: AC

## 2021-10-13 MED ORDER — LEVETIRACETAM 1000 MG PO TABS: 1000.0000 mg | ORAL_TABLET | Freq: Two times a day (BID) | ORAL | 2 refills | Status: DC

## 2021-10-13 NOTE — Progress Notes (Addendum)
Patient ready for discharge to home; discharge instructions given and reviewed; reviewed care of surgical incision; reviewed follow up with Dr. Trenton Gammon in office for staple removal; patient dressed and ready; discharged out via wheelchair accompanied home by her sister. Instructed to notify Dr.Poole for any fever, drainage from in incision, uncontrolled pain; call office.

## 2021-10-13 NOTE — Discharge Summary (Signed)
Discharge Summary  Shannon Hayes XTK:[id number] DOB: 1971/01/29  PCP: Pcp, No  Admit date: 10/08/2021 Discharge date: 10/13/2021  Time spent: 35 minutes.  Recommendations for Outpatient Follow-up:  Follow-up with your medical oncologist. Follow-up with neurosurgery. Follow-up with your PCP. Take your medications as described.   Discharge Diagnoses:  Active Hospital Problems   Diagnosis Date Noted   Frontal mass of brain 10/08/2021   Malignant neoplasm of overlapping sites of left breast in female, estrogen receptor positive ([place]) 11/28/2020    Resolved Hospital Problems  No resolved problems to display.    Discharge Condition: Hayes.  Diet recommendation: Resume previous diet.  Vitals:   10/13/21 0401 10/13/21 0746  BP: 117/67 121/83  Pulse: 62 71  Resp: (!) 22 20  Temp: 98.7 F (37.1 C) 98.2 F (36.8 C)  SpO2: 100% 100%    History of present illness:  [name] with hx of breast cancer s/p lumpectomy, XRT, chemo followed by Dr. Marylou Hayes with [facility] heme/onc, presents to [facility] ER from her office at [facility].   Patient reportedly had a seizure and fell to the ground.  Patient was brought in unconscious.  [name].  Her base hospital is [facility] in [place].  Work-up revealed a 2.8 cm left frontal lobe mass, seen on CT scan and confirmed on MRI brain.  CT head showed large frontal edema.  With regional mass-effect and right midline shift.  Brain MRI demonstrated 2.8 cm left frontal lobe mass with surrounding vasogenic edema.  There is effacement of the ventricle.  With a midline shift.  Neurology and neurosurgery were both consulted.  Patient received a loading dose of IV Decadron and IV Keppra.  Admitted by hospitalist service.   Post left frontal craniotomy and resection of tumor on 10/11/2021 by Dr. Annette Hayes, neurosurgery.    10/13/2021: Patient was seen at her bedside.  Her sister was present in the room.  There  were no acute events overnight.  She has no new complaints.  Okay to discharge home from neurosurgery standpoint.  Hospital Course:  Principal Problem:   Frontal mass of brain Active Problems:   Malignant neoplasm of overlapping sites of left breast in female, estrogen receptor positive (HCC)  Left breast cancer, estrogen receptor positive Followed at [facility] cancer center. Completed lumpectomy, chemotherapy and radiation. Currently on Femara, restarted on 10/12/2021 Follow-up with your medical oncologist at [facility].   Newly diagnosed left frontal mass of brain with concern for metastatic breast cancer versus meningioma s/p left frontal craniotomy and resection of tumor on 10/11/2021 by Dr. Annette Hayes, neurosurgery. Seen by neurology and neurosurgery She received a loading dose of IV Decadron and IV Keppra.  Currently on IV Received Decadron 8 mg twice daily and Keppra 1000 g twice daily. Currently on po decadron 4mg  q6h, po keppra 1000mg  BID No recurrent seizures, continue seizure precautions. No acute issues post neurosurgery Pathology result will be provided by neurosurgery. Follow-up with neurosurgery.   Hyperglycemia, likely due to IV steroids Hemoglobin A1c 4.7 on 10/04/2021. CBGs levels are improved Follow-up with your PCP.   Resolved mild non-anion gap metabolic acidosis Serum bicarb 21> 26>24. Received gentle IV fluid hydration.   Obesity BMI 31 Recommend weight loss outpatient with regular physical activity and healthy dieting.      Code Status: Full code   [name].       Consultants: Neurosurgery Neurology Neurooncology, Dr. Mickeal Hayes on 10/10/2021.   Procedures: None  Antimicrobials: None     Discharge Exam: BP 121/83 (BP Location: Left Arm)    Pulse 71    Temp 98.2 F (36.8 C) (Oral)    Resp 20    Ht 5\' 3"  (1.6 m)    Wt 81 kg    LMP 08/28/2020 (Approximate)    SpO2 100%    BMI 31.63 kg/m  General: 51 y.o. year-old female  well developed well nourished in no acute distress.  Alert and oriented x3. Cardiovascular: Regular rate and rhythm with no rubs or gallops.  No thyromegaly or JVD noted.   Respiratory: Clear to auscultation with no wheezes or rales. Good inspiratory effort. Abdomen: Soft nontender nondistended with normal bowel sounds x4 quadrants. Musculoskeletal: No lower extremity edema. 2/4 pulses in all 4 extremities. Skin: No ulcerative lesions noted or rashes, Psychiatry: Mood is appropriate for condition and setting  Discharge Instructions You were cared for by a hospitalist during your hospital stay. If you have any questions about your discharge medications or the care you received while you were in the hospital after you are discharged, you can call the unit and asked to speak with the hospitalist on call if the hospitalist that took care of you is not available. Once you are discharged, your primary care physician will handle any further medical issues. Please note that NO REFILLS for any discharge medications will be authorized once you are discharged, as it is imperative that you return to your primary care physician (or establish a relationship with a primary care physician if you do not have one) for your aftercare needs so that they can reassess your need for medications and monitor your lab values.   Allergies as of 10/13/2021   No Known Allergies      Medication List     TAKE these medications    abemaciclib 150 MG tablet Commonly known as: VERZENIO Take 150 mg by mouth in the morning and at bedtime.   dexamethasone 1 MG tablet Commonly known as: DECADRON Take 4 tablets (4 mg total) by mouth 3 (three) times daily for 2 days, THEN 2 tablets (2 mg total) 3 (three) times daily for 2 days, THEN 2 tablets (2 mg total) 2 (two) times daily for 2 days, THEN 1 tablet (1 mg total) 2 (two) times daily for 2 days, THEN 1 tablet (1 mg total) daily for 2 days. Start taking on: October 13, 2021    letrozole 2.5 MG tablet Commonly known as: FEMARA Take 2.5 mg by mouth every morning.   levETIRAcetam 1000 MG tablet Commonly known as: KEPPRA Take 1 tablet (1,000 mg total) by mouth 2 (two) times daily.   pantoprazole 40 MG tablet Commonly known as: PROTONIX Take 1 tablet (40 mg total) by mouth daily. Start taking on: October 14, 2021       No Known Allergies  Follow-up Information     Earnie Larsson, MD. Call in 1 day(s).   Specialty: Neurosurgery Why: Call the office to schedule postop appt for staple removal from craniootomy site Contact information: [address] [place] [address] [phone]                  The results of significant diagnostics from this hospitalization (including imaging, microbiology, ancillary and laboratory) are listed below for reference.    Significant Diagnostic Studies: CT Head Wo Contrast  Result Date: 10/08/2021 CLINICAL DATA:  Mental status change, unknown cause possible seizure, h/o breast cancer EXAM: CT HEAD WITHOUT  CONTRAST TECHNIQUE: Contiguous axial images were obtained from the base of the skull through the vertex without intravenous contrast. RADIATION DOSE REDUCTION: This exam was performed according to the departmental dose-optimization program which includes automated exposure control, adjustment of the mA and/or kV according to patient size and/or use of iterative reconstruction technique. COMPARISON:  None. FINDINGS: Brain: There is significant edema in the left frontal lobe. There could be some loss of gray differentiation is region. Effacement of adjacent ventricles with rightward midline shift measuring 4 mm. No hydrocephalus. Remainder of brain is unremarkable. No acute intracranial hemorrhage. Vascular: There is mild atherosclerotic calcification at the skull base. Skull: Calvarium is unremarkable. Sinuses/Orbits: No acute finding. Other: None. IMPRESSION: Left frontal edema, probably vasogenic and  secondary to an underlying metastasis given history. Regional mass effect is present with mild rightward midline shift. Contrast enhanced MRI is recommended. These results were called by telephone at the time of interpretation on 10/08/2021 at 5:47 pm to provider [facility] , who verbally acknowledged these results. Electronically Signed   By: Macy Mis M.D.   On: 10/08/2021 17:47   CT Cervical Spine Wo Contrast  Result Date: 10/08/2021 CLINICAL DATA:  Neck trauma, dangerous injury mechanism (Age 61-64y) EXAM: CT CERVICAL SPINE WITHOUT CONTRAST TECHNIQUE: Multidetector CT imaging of the cervical spine was performed without intravenous contrast. Multiplanar CT image reconstructions were also generated. RADIATION DOSE REDUCTION: This exam was performed according to the departmental dose-optimization program which includes automated exposure control, adjustment of the mA and/or kV according to patient size and/or use of iterative reconstruction technique. COMPARISON:  None. FINDINGS: Alignment: Preserved. Skull base and vertebrae: Vertebral body heights are maintained. No acute fracture. No destructive osseous lesion. Soft tissues and spinal canal: No prevertebral fluid or swelling. No visible canal hematoma. Disc levels:  Mild degenerative changes at C4-C5. Upper chest: No apical lung mass. Other: Partially imaged right chest wall port catheter. IMPRESSION: No acute cervical spine fracture. Electronically Signed   By: Macy Mis M.D.   On: 10/08/2021 17:49   MR BRAIN W WO CONTRAST  Result Date: 10/11/2021 CLINICAL DATA:  Surveillance of intracranial neoplasm EXAM: MRI HEAD WITHOUT AND WITH CONTRAST TECHNIQUE: Multiplanar, multiecho pulse sequences of the brain and surrounding structures were obtained without and with intravenous contrast. CONTRAST:  51mL GADAVIST GADOBUTROL 1 MMOL/ML IV SOLN COMPARISON:  10/09/2021 FINDINGS: Brain: No acute infarct. Status post resection of left frontal lesion. The  area of hyperintense T2-weighted signal within the surrounding left frontal white matter is unchanged. There is trace rightward midline shift, improved from the preoperative examination. There is no residual nodular contrast enhancement at the resection site. There is mild pneumocephalus. Vascular: Normal flow voids. Skull and upper cervical spine: Normal marrow signal. Sinuses/Orbits: Negative. Other: None. IMPRESSION: Status post resection of left frontal lesion. No residual nodular contrast enhancement at the resection site. Electronically Signed   By: Ulyses Jarred M.D.   On: 10/11/2021 23:13   MR BRAIN W WO CONTRAST  Result Date: 10/09/2021 CLINICAL DATA:  Brain mass EXAM: MRI HEAD WITHOUT AND WITH CONTRAST TECHNIQUE: Multiplanar, multiecho pulse sequences of the brain and surrounding structures were obtained without and with intravenous contrast. CONTRAST:  8.4mL GADAVIST GADOBUTROL 1 MMOL/ML IV SOLN COMPARISON:  10/08/2021 FINDINGS: Brain: Hayes heterogeneously enhancing mass of the left frontal lobe measuring up to 2.8 cm. This extends to the cortical margin. Enhancement within adjacent sulci is probably vascular. Extensive surrounding edema with Hayes regional mass effect. No new finding. Vascular: Major vessel  flow voids at the skull base are preserved. Skull and upper cervical spine: Normal marrow signal is preserved. Sinuses/Orbits: Paranasal sinuses are aerated. Orbits are unremarkable. Other: [place] is unremarkable.  Mastoid air cells are clear. IMPRESSION: Unchanged 2.8 cm left frontal lobe mass with associated edema and regional mass effect. Mass makes dural contact but is not definitively extra-axial. Electronically Signed   By: Macy Mis M.D.   On: 10/09/2021 16:52   MR BRAIN W WO CONTRAST  Result Date: 10/08/2021 CLINICAL DATA:  Mental status change, history of breast cancer, seizure, possible metastasis on head CT EXAM: MRI HEAD WITHOUT AND WITH CONTRAST TECHNIQUE: Multiplanar,  multiecho pulse sequences of the brain and surrounding structures were obtained without and with intravenous contrast. CONTRAST:  7.73mL GADAVIST GADOBUTROL 1 MMOL/ML IV SOLN COMPARISON:  10/08/2021 FINDINGS: Brain: Heterogeneously enhancing mass in the left frontal lobe, which measures 2.8 x 2.1 x 2.0 cm (AP x TR x CC) (series 23, image 37 and series 24, image 20). Significant surrounding T2 hyperintense signal, most likely edema, which causes local mass effect with effacement of the left-greater-than-right lateral ventricle and approximately 5 mm of left-to-right midline shift. No restricted diffusion to suggest acute infarct. No acute hemorrhage. No hydrocephalus or extra-axial collection. The hippocampi are symmetric in size and normal in signal. No evidence of gray matter heterotopia or underlying cortical dysplasia. Vascular: Normal flow voids. The ACA flow voids are deviated to the right by the edema surrounding the mass, but appear patent. Skull and upper cervical spine: Normal marrow signal. Sinuses/Orbits: Negative. Other: The mastoids are well aerated. IMPRESSION: 1. 2.8 cm left frontal lobe mass, favored to represent a metastatic lesion, given the patient's history of breast cancer and degree of surrounding edema, versus a primary CNS neoplasm. 2. Edema surrounding left frontal lobe mass with local mass effect, effacement of the left-greater-than-right lateral ventricles, and 5 mm of left-to-right midline shift. 3. No other seizure etiology identified. Electronically Signed   By: Merilyn Baba M.D.   On: 10/08/2021 21:32   EEG adult  Result Date: 10/09/2021 Lora Havens, MD     10/09/2021 11:59 AM Patient Name: Shannon Hayes MRN: [id number] Epilepsy Attending: Lora Havens Referring Physician/Provider: Greta Doom, MD Date: 10/09/2021 Duration: 22.25 mins Patient history: 51 year old female with a single mass in L frontal lobe of unknown etiology who experienced a single seizure on  1/24.  EEG to evaluate for seizure. Level of alertness: Awake, asleep AEDs during EEG study: LEV Technical aspects: This EEG study was done with scalp electrodes positioned according to the 10-20 International system of electrode placement. Electrical activity was acquired at a sampling rate of 500Hz  and reviewed with a high frequency filter of 70Hz  and a low frequency filter of 1Hz . EEG data were recorded continuously and digitally stored. Description: The posterior dominant rhythm consists of 9 Hz activity of moderate voltage (25-35 uV) seen predominantly in posterior head regions, symmetric and reactive to eye opening and eye closing. Sleep was characterized by sleep spindles (12 to 14 Hz), maximal frontocentral region.  Sharp transients were seen in left frontal region. Physiologic photic driving was seen during photic stimulation. Hyperventilation was not performed.   IMPRESSION: This study is within normal limits. No seizures or definite epileptiform discharges were seen throughout the recording. Lora Havens    Microbiology: Recent Results (from the past 240 hour(s))  Resp Panel by RT-PCR (Flu A&B, Covid) Nasopharyngeal Swab     Status: None   Collection  Time: 10/08/21  3:51 PM   Specimen: Nasopharyngeal Swab; Nasopharyngeal(NP) swabs in vial transport medium  Result Value Ref Range Status   SARS Coronavirus 2 by RT PCR NEGATIVE NEGATIVE Final    Comment: (NOTE) SARS-CoV-2 target nucleic acids are NOT DETECTED.  The SARS-CoV-2 RNA is generally detectable in upper respiratory specimens during the acute phase of infection. The lowest concentration of SARS-CoV-2 viral copies this assay can detect is 138 copies/mL. A negative result does not preclude SARS-Cov-2 infection and should not be used as the sole basis for treatment or other patient management decisions. A negative result may occur with  improper specimen collection/handling, submission of specimen other than nasopharyngeal swab,  presence of viral mutation(s) within the areas targeted by this assay, and inadequate number of viral copies(<138 copies/mL). A negative result must be combined with clinical observations, patient history, and epidemiological information. The expected result is Negative.  Fact Sheet for Patients:  EntrepreneurPulse.com.au  Fact Sheet for Healthcare Providers:  IncredibleEmployment.be  This test is no t yet approved or cleared by the [place] FDA and  has been authorized for detection and/or diagnosis of SARS-CoV-2 by FDA under an Emergency Use Authorization (EUA). This EUA will remain  in effect (meaning this test can be used) for the duration of the COVID-19 declaration under Section 564(b)(1) of the Act, 21 U.S.C.section 360bbb-3(b)(1), unless the authorization is terminated  or revoked sooner.       Influenza A by PCR NEGATIVE NEGATIVE Final   Influenza B by PCR NEGATIVE NEGATIVE Final    Comment: (NOTE) The Xpert Xpress SARS-CoV-2/FLU/RSV plus assay is intended as an aid in the diagnosis of influenza from Nasopharyngeal swab specimens and should not be used as a sole basis for treatment. Nasal washings and aspirates are unacceptable for Xpert Xpress SARS-CoV-2/FLU/RSV testing.  Fact Sheet for Patients: EntrepreneurPulse.com.au  Fact Sheet for Healthcare Providers: IncredibleEmployment.be  This test is not yet approved or cleared by the [place] FDA and has been authorized for detection and/or diagnosis of SARS-CoV-2 by FDA under an Emergency Use Authorization (EUA). This EUA will remain in effect (meaning this test can be used) for the duration of the COVID-19 declaration under Section 564(b)(1) of the Act, 21 U.S.C. section 360bbb-3(b)(1), unless the authorization is terminated or revoked.  Performed at [facility], [place] [address] [place], [place] [address]   Surgical PCR  screen     Status: None   Collection Time: 10/11/21  9:26 AM   Specimen: Nasal Mucosa; Nasal Swab  Result Value Ref Range Status   MRSA, PCR NEGATIVE NEGATIVE Final   Staphylococcus aureus NEGATIVE NEGATIVE Final    Comment: (NOTE) The Xpert SA Assay (FDA approved for NASAL specimens in patients 12 years of age and older), is one component of a comprehensive surveillance program. It is not intended to diagnose infection nor to guide or monitor treatment. Performed at [facility], [place] [address], [place], [place] [address]   Aerobic/Anaerobic Culture w Gram Stain (surgical/deep wound)     Status: None (Preliminary result)   Collection Time: 10/11/21  3:41 PM   Specimen: PATH Other; Tissue  Result Value Ref Range Status   Specimen Description TISSUE  Final   Special Requests LEFT FRONTAL BRAIN TUMOR  Final   Gram Stain   Final    RARE WBC PRESENT, PREDOMINANTLY MONONUCLEAR FEW GRAM POSITIVE COCCI    Culture   Final    NO GROWTH 2 DAYS NO ANAEROBES ISOLATED; CULTURE  IN PROGRESS FOR 5 DAYS Performed at [facility], [place] [address] [place], [place] [address]    Report Status PENDING  Incomplete  MRSA Next Gen by PCR, Nasal     Status: None   Collection Time: 10/11/21  6:44 PM   Specimen: Nasal Mucosa; Nasal Swab  Result Value Ref Range Status   MRSA by PCR Next Gen NOT DETECTED NOT DETECTED Final    Comment: (NOTE) The GeneXpert MRSA Assay (FDA approved for NASAL specimens only), is one component of a comprehensive MRSA colonization surveillance program. It is not intended to diagnose MRSA infection nor to guide or monitor treatment for MRSA infections. Test performance is not FDA approved in patients less than 9 years old. Performed at [facility], [place] [address], [place], [place] [address]      Labs: Basic Metabolic Panel: Recent Labs  Lab 10/08/21 1627 10/08/21 1634 10/09/21 0313 10/10/21 0712 10/11/21 0244 10/12/21 0452  NA 137 138 135 140  140 140  K 4.6 4.3 3.9 4.1 4.1 3.7  CL 104 106 107 108 110 108  CO2 18*  --  21* 26 25 24   GLUCOSE 128* 136* 140* 126* 151* 121*  BUN 12 15 9 18 14 10   CREATININE 0.90 0.80 0.83 0.80 0.80 0.72  CALCIUM 9.3  --  9.7 9.2 8.8* 8.7*  MG  --   --  2.0 2.2 2.2 2.0  PHOS  --   --   --  3.8 3.2 4.4   Liver Function Tests: Recent Labs  Lab 10/08/21 1627 10/09/21 0313  AST 44* 22  ALT 10 11  ALKPHOS 54 65  BILITOT 1.5* 0.5  PROT 6.8 6.9  ALBUMIN 3.7 3.6   No results for input(s): LIPASE, AMYLASE in the last 168 hours. No results for input(s): AMMONIA in the last 168 hours. CBC: Recent Labs  Lab 10/08/21 1627 10/08/21 1634 10/09/21 0313 10/10/21 0712 10/11/21 0244 10/12/21 0452  WBC 5.0  --  5.3 7.4 7.0 12.1*  NEUTROABS 4.0  --  4.9  --   --   --   HGB 12.9 12.9 12.7 11.9* 11.9* 12.1  HCT 38.8 38.0 37.8 34.4* 36.0 34.4*  MCV 95.3  --  92.9 91.7 93.3 90.8  PLT 197  --  203 206 200 192   Cardiac Enzymes: No results for input(s): CKTOTAL, CKMB, CKMBINDEX, TROPONINI in the last 168 hours. BNP: BNP (last 3 results) No results for input(s): BNP in the last 8760 hours.  ProBNP (last 3 results) No results for input(s): PROBNP in the last 8760 hours.  CBG: Recent Labs  Lab 10/12/21 1518 10/12/21 1947 10/13/21 0002 10/13/21 0402 10/13/21 0755  GLUCAP 131* 140* 117* 142* 134*       Signed:  Kayleen Memos, MD [facility] 10/13/2021, 12:54 PM

## 2021-10-13 NOTE — Progress Notes (Signed)
Subjective: Patient reports that she is doing well and currently has no complaints. She stated she is ready to go home. No acute events overnight.   Objective: Vital signs in last 24 hours: Temp:  [98.2 F (36.8 C)-99.7 F (37.6 C)] 98.2 F (36.8 C) (01/29 0746) Pulse Rate:  [60-74] 71 (01/29 0746) Resp:  [12-22] 20 (01/29 0746) BP: (113-132)/(67-83) 121/83 (01/29 0746) SpO2:  [98 %-100 %] 100 % (01/29 0746)  Intake/Output from previous day: 01/28 0701 - 01/29 0700 In: 916.1 [I.V.:816.1; IV Piggyback:100] Out: 300 [Urine:300] Intake/Output this shift: Total I/O In: 240 [P.O.:240] Out: -   Physical Exam: Patient is awake, A/O X 4, conversant, and in good spirits. They are in NAD and VSS. Doing well. Speech is fluent and appropriate. MAEW with good strength. No drift Sensation to light touch is intact. PERLA, EOMI. CNs grossly intact. Dressing is clean dry intact. Incision is well approximated with no drainage, erythema, or edema.   Lab Results: Recent Labs    10/11/21 0244 10/12/21 0452  WBC 7.0 12.1*  HGB 11.9* 12.1  HCT 36.0 34.4*  PLT 200 192   BMET Recent Labs    10/11/21 0244 10/12/21 0452  NA 140 140  K 4.1 3.7  CL 110 108  CO2 25 24  GLUCOSE 151* 121*  BUN 14 10  CREATININE 0.80 0.72  CALCIUM 8.8* 8.7*    Studies/Results: MR BRAIN W WO CONTRAST  Result Date: 10/11/2021 CLINICAL DATA:  Surveillance of intracranial neoplasm EXAM: MRI HEAD WITHOUT AND WITH CONTRAST TECHNIQUE: Multiplanar, multiecho pulse sequences of the brain and surrounding structures were obtained without and with intravenous contrast. CONTRAST:  19mL GADAVIST GADOBUTROL 1 MMOL/ML IV SOLN COMPARISON:  10/09/2021 FINDINGS: Brain: No acute infarct. Status post resection of left frontal lesion. The area of hyperintense T2-weighted signal within the surrounding left frontal white matter is unchanged. There is trace rightward midline shift, improved from the preoperative examination. There is no  residual nodular contrast enhancement at the resection site. There is mild pneumocephalus. Vascular: Normal flow voids. Skull and upper cervical spine: Normal marrow signal. Sinuses/Orbits: Negative. Other: None. IMPRESSION: Status post resection of left frontal lesion. No residual nodular contrast enhancement at the resection site. Electronically Signed   By: Ulyses Jarred M.D.   On: 10/11/2021 23:13    Assessment/Plan: 51 y.o. female who is POD # 2 S/P left frontal craniotomy with stereotactic guidance and resection of left frontal tumor. She is continuing to do well. Her neurological exam is at baseline. She is stable to discharge from a neurosurgical perspective. She will need follow in the outpatient setting for staple removal. Will slowly taper Decadron. Continue Keppra.   - Dexamethasone taper  - cont Keppra -   LOS: 5 days     Marvis Moeller, DNP, NP-C 10/13/2021, 11:10 AM

## 2021-10-13 NOTE — Evaluation (Signed)
Occupational Therapy Evaluation Patient Details Name: Shannon Hayes MRN: [id number] DOB: 22-Oct-1970 Today's Date: 10/13/2021   History of Present Illness 51 y/o female presented to ED on 10/08/21 following witnessed seizure. 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 S/p L frontal craniotomy with tumor resection on 1/27. PMH: breast cancer   Clinical Impression   Shannon Hayes has an indep baseline, she works, drives and lives with her sister in [place] town home with [place] flight of step to her bedroom/bathroom. Upon evaluation pt demonstrated independently ability to complete ADLs and functional mobility within the room without AD. She does not require further OT acutely or at d/c. Recommend pt d/c to home with assist of her sister as needed.      Recommendations for follow up therapy are one component of Hayes multi-disciplinary discharge planning process, led by the attending physician.  Recommendations may be updated based on patient status, additional functional criteria and insurance authorization.   Follow Up Recommendations  No OT follow up    Assistance Recommended at Discharge Intermittent Supervision/Assistance  Patient can return home with the following Hayes little help with bathing/dressing/bathroom;Assist for transportation    Functional Status Assessment  Patient has had Hayes recent decline in their functional status and demonstrates the ability to make significant improvements in function in Hayes reasonable and predictable amount of time.  Equipment Recommendations  None recommended by OT       Precautions / Restrictions Precautions Precautions: Fall Restrictions Weight Bearing Restrictions: No      Mobility Bed Mobility Overal bed mobility: Modified Independent             General bed mobility comments: HOB elevated this session    Transfers Overall transfer level: Modified independent Equipment used: None                      Balance Overall  balance assessment: Modified Independent, Mild deficits observed, not formally tested           ADL either performed or assessed with clinical judgement   ADL Overall ADL's : Modified independent     General ADL Comments: Pt demonstrated indep ADLs, some increased time noted for safety 1 verbal cue for IV pole management     Vision Baseline Vision/History: 0 No visual deficits Vision Assessment?: No apparent visual deficits            Pertinent Vitals/Pain Pain Assessment Pain Assessment: No/denies pain        Extremity/Trunk Assessment Upper Extremity Assessment Upper Extremity Assessment: Overall WFL for tasks assessed   Lower Extremity Assessment Lower Extremity Assessment: Overall WFL for tasks assessed   Cervical / Trunk Assessment Cervical / Trunk Assessment: Normal   Communication Communication Communication: No difficulties   Cognition Arousal/Alertness: Awake/alert Behavior During Therapy: WFL for tasks assessed/performed, Flat affect Overall Cognitive Status: Within Functional Limits for tasks assessed         General Comments: pt flat throughout but otherwise cog was [facility]     General Comments  VSS on RA, pt's sister presenst and supportive            Home Living Family/patient expects to be discharged to:: Private residence Living Arrangements: Other relatives (sister) Available Help at Discharge: Family;Available 24 hours/day Type of Home: House Home Access: Stairs to enter [employer] of Steps: 2 Entrance Stairs-Rails: Right Home Layout: Two level;Bed/bath upstairs Alternate Level Stairs-Number of Steps: 12 Alternate Level Stairs-Rails: Right Bathroom Shower/Tub: Tub/shower unit  Bathroom Toilet: Handicapped height Bathroom Accessibility: No   Home Equipment: None          Prior Functioning/Environment Prior Level of Function : Independent/Modified Independent;Working/employed;Driving                         OT Problem List: Decreased activity tolerance;Decreased safety awareness;Decreased knowledge of precautions         OT Goals(Current goals can be found in the care plan section) Acute Rehab OT Goals Patient Stated Goal: home OT Goal Formulation: All assessment and education complete, DC therapy Time For Goal Achievement: 10/13/21   AM-PAC OT "6 Clicks" Daily Activity     Outcome Measure Help from another person eating meals?: None Help from another person taking care of personal grooming?: None Help from another person toileting, which includes using toliet, bedpan, or urinal?: None Help from another person bathing (including washing, rinsing, drying)?: None Help from another person to put on and taking off regular upper body clothing?: None Help from another person to put on and taking off regular lower body clothing?: None 6 Click Score: 24   End of Session Nurse Communication: Mobility status  Activity Tolerance: Patient tolerated treatment well Patient left: in bed;with call bell/phone within reach;with family/visitor present  OT Visit Diagnosis: Unsteadiness on feet (R26.81)                Time: 1100-1115 OT Time Calculation (min): 15 min Charges:  OT General Charges $OT Visit: 1 Visit OT Evaluation $OT Eval Low Complexity: 1 Low  Shannon Hayes Unnamed Hino 10/13/2021, 11:17 AM

## 2021-10-14 ENCOUNTER — Encounter (HOSPITAL_COMMUNITY): Payer: Self-pay | Admitting: Neurosurgery

## 2021-10-17 LAB — AEROBIC/ANAEROBIC CULTURE W GRAM STAIN (SURGICAL/DEEP WOUND)

## 2021-10-18 LAB — SURGICAL PATHOLOGY

## 2021-11-01 ENCOUNTER — Other Ambulatory Visit: Payer: Self-pay

## 2021-11-01 ENCOUNTER — Other Ambulatory Visit (HOSPITAL_COMMUNITY): Payer: Self-pay | Admitting: Student

## 2021-11-01 ENCOUNTER — Other Ambulatory Visit: Payer: Self-pay | Admitting: Neurosurgery

## 2021-11-01 ENCOUNTER — Ambulatory Visit (HOSPITAL_COMMUNITY)
Admission: RE | Admit: 2021-11-01 | Discharge: 2021-11-01 | Disposition: A | Discharge status: Home / Self Care | Payer: 59 | Source: Ambulatory Visit | Attending: Student | Admitting: Student

## 2021-11-01 ENCOUNTER — Other Ambulatory Visit: Payer: Self-pay | Admitting: Student

## 2021-11-01 DIAGNOSIS — C7931 Secondary malignant neoplasm of brain: Secondary | ICD-10-CM | POA: Diagnosis not present

## 2021-11-01 IMAGING — MR MR HEAD WO/W CM
9 of 13 series · 34 of 48 positions shown · IV contrast (Yes   MULTIHANCE)
Comparison: [DATE].

CLINICAL DATA: Brain metastases (HCC) [DL] ([DL]-CM)

EXAM:
MRI HEAD WITHOUT AND WITH CONTRAST
TECHNIQUE: Multiplanar, multiecho pulse sequences of the brain and surrounding
structures were obtained without and with intravenous contrast.
CONTRAST:  8mL GADAVIST GADOBUTROL 1 MMOL/ML IV SOLN

[Series 3: DWI · axial · 3.0mm · 1.09mm/px · z∈[-70,+67]mm · 9 of 96 slices shown (1 of 4)]
[im 1/96]
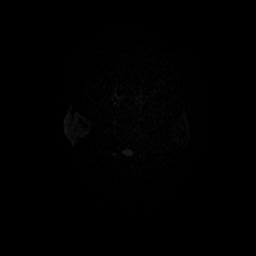
[im 12/96]
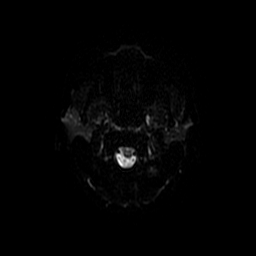
[im 24/96]
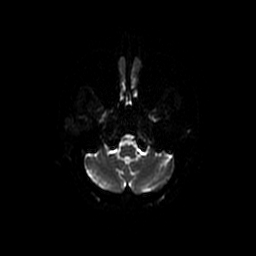
[im 36/96]
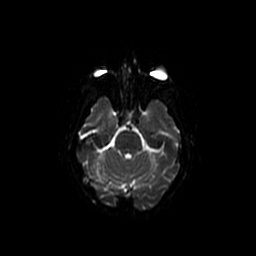
[im 48/96]
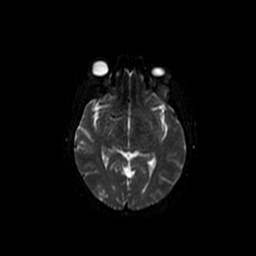
[im 60/96]
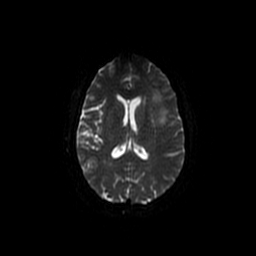
[im 72/96]
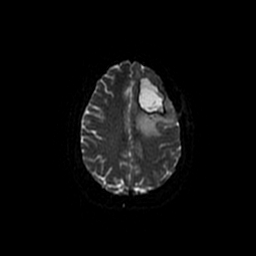
[im 84/96]
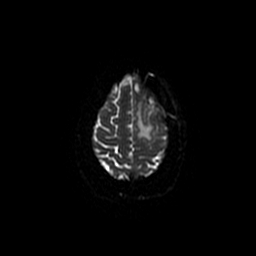
[im 96/96]
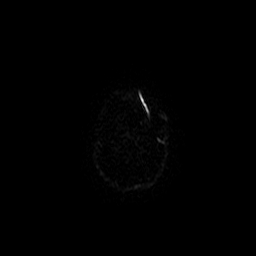

[Series 4: DWI · coronal · 5.0mm · 1.09mm/px · 6 of 72 slices shown (2 of 4)]
[im 1/72]
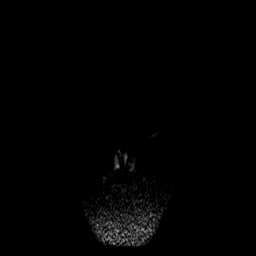
[im 15/72]
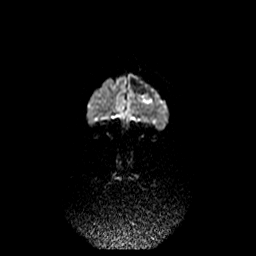
[im 29/72]
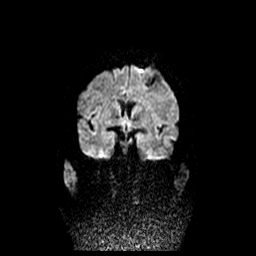
[im 43/72]
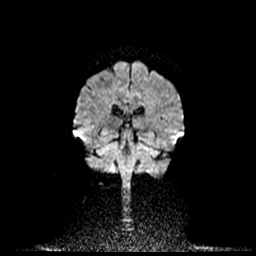
[im 57/72]
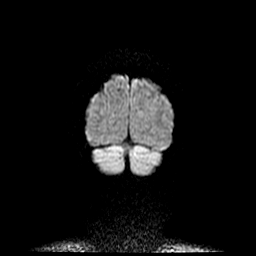
[im 72/72]
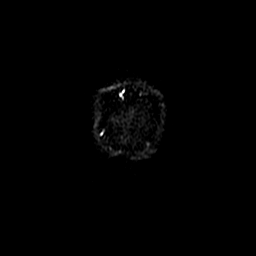

[Series 6: T2 · axial · 5.0mm · 0.47mm/px · z∈[-56,+85]mm · 2 of 25 slices shown]
[im 1/25]
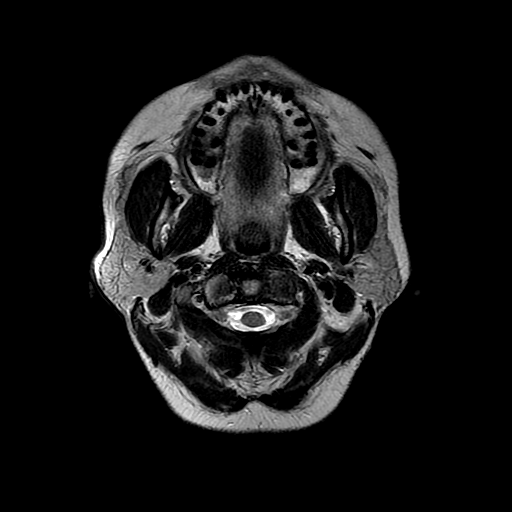
[im 25/25]
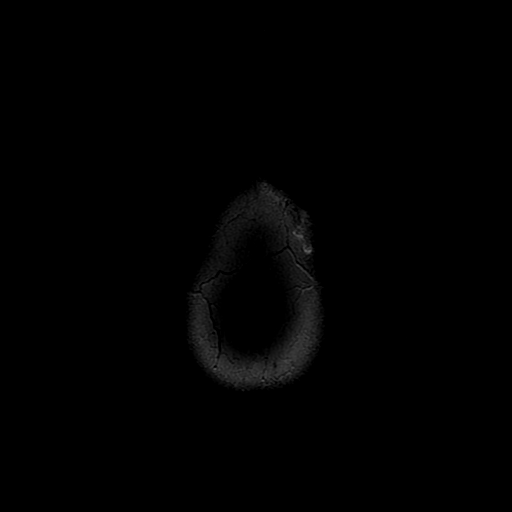

[Series 7: FLAIR · axial · 3.0mm · 0.47mm/px · z∈[-56,+85]mm · 2 of 25 slices shown]
[im 1/25]
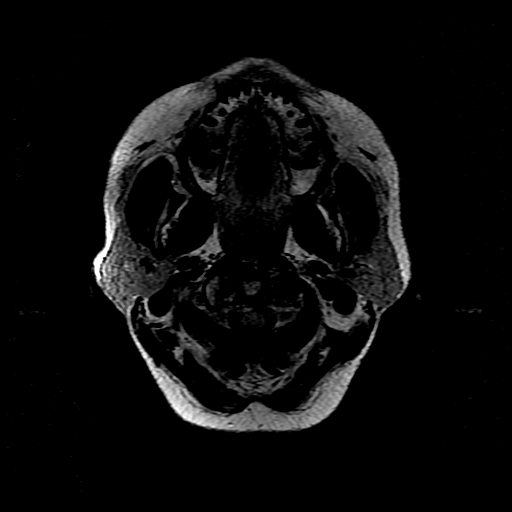
[im 25/25]
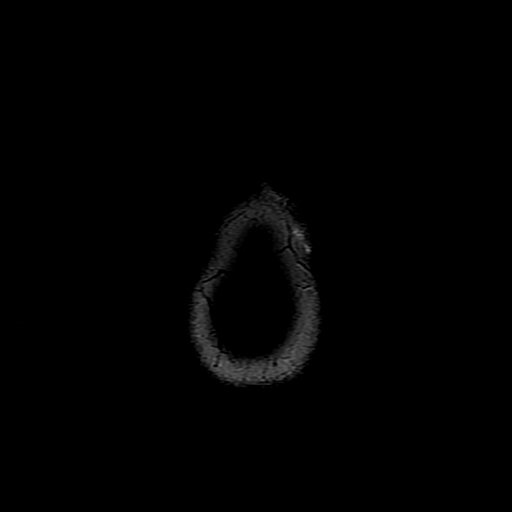

[Series 11: T1 post-contrast · axial · 3.0mm · 0.47mm/px · z∈[-54,+90]mm · 4 of 50 slices shown (1 of 3)]
[im 1/50]
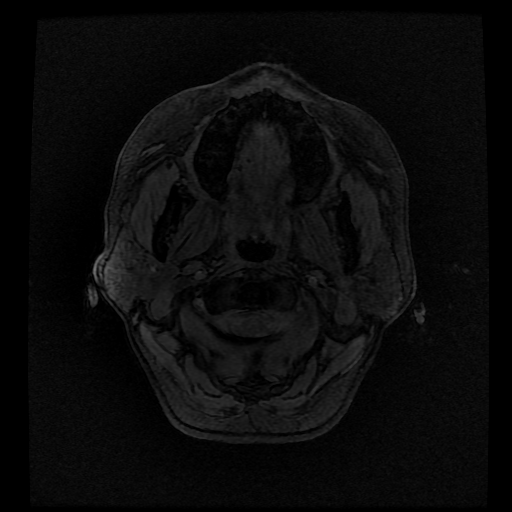
[im 17/50]
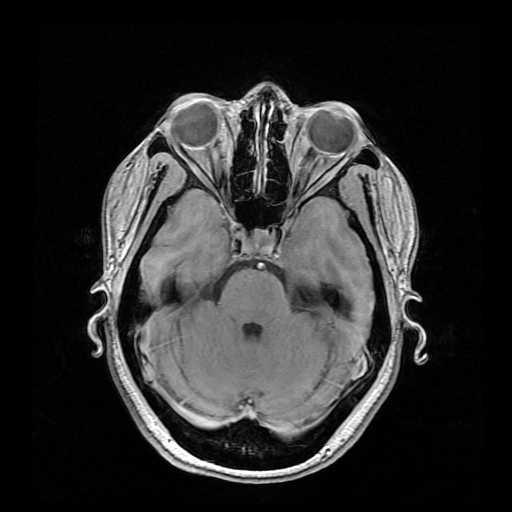
[im 33/50]
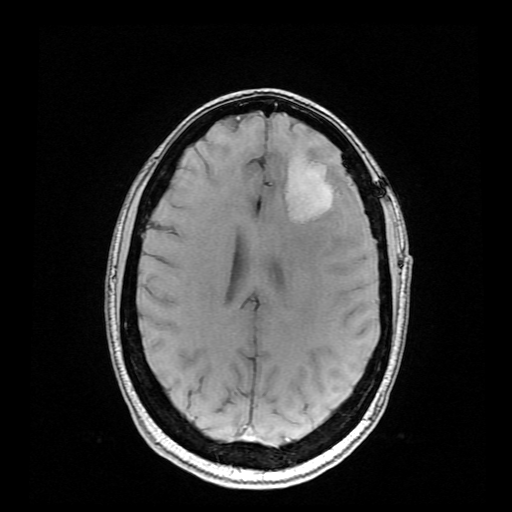
[im 50/50]
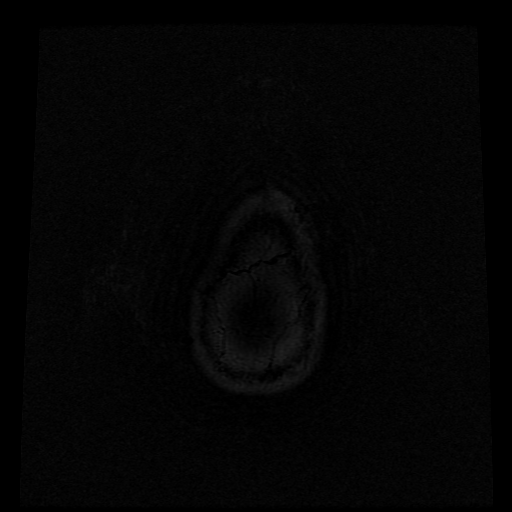

[Series 12: T1 post-contrast · coronal · 5.0mm · 0.39mm/px · 2 of 28 slices shown (2 of 3)]
[im 1/28]
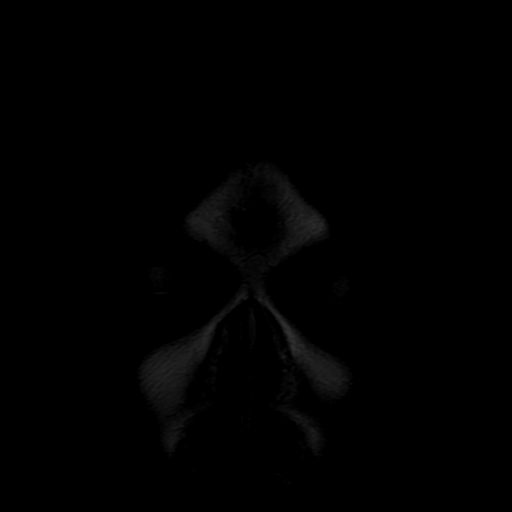
[im 28/28]
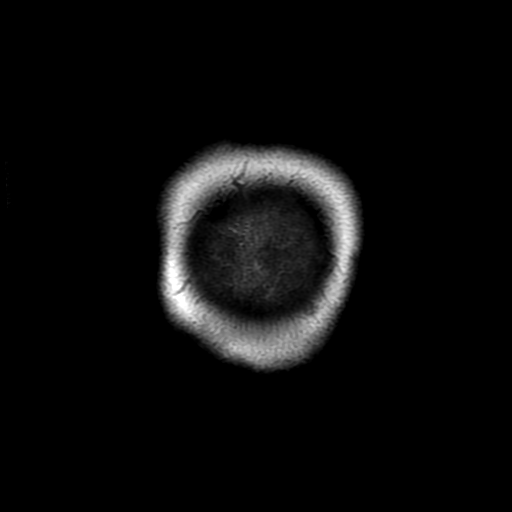

[Series 13: T1 post-contrast · sagittal · 5.0mm · 0.47mm/px · 2 of 24 slices shown (3 of 3)]
[im 1/24]
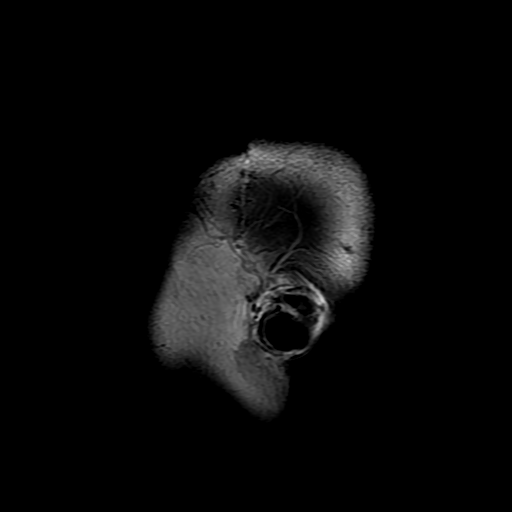
[im 24/24]
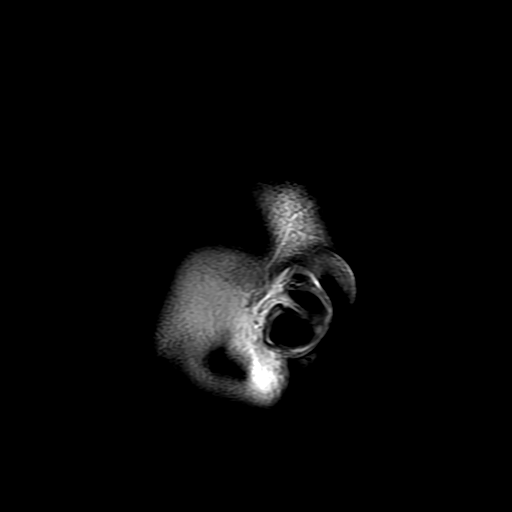

[Series 300: DWI · axial · 3.0mm · 1.09mm/px · z∈[-70,+67]mm · 4 of 48 slices shown (3 of 4)]
[im 1/48]
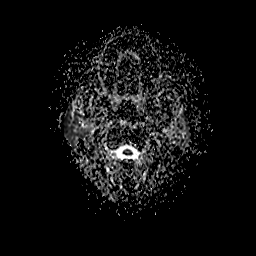
[im 16/48]
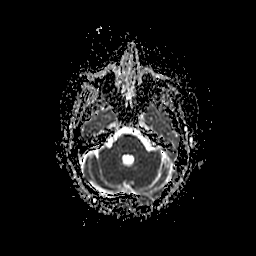
[im 32/48]
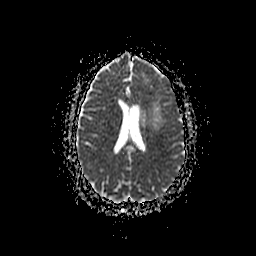
[im 48/48]
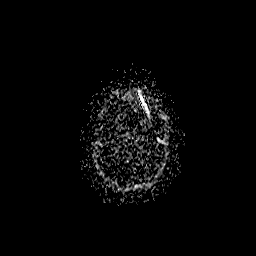

[Series 400: DWI · coronal · 5.0mm · 1.09mm/px · 3 of 36 slices shown (4 of 4)]
[im 1/36]
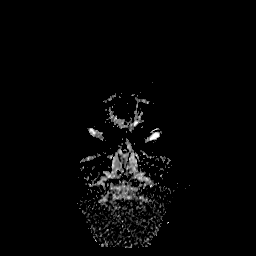
[im 18/36]
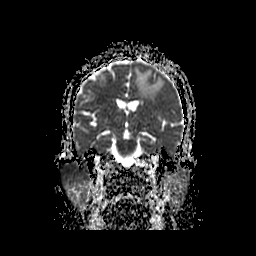
[im 36/36]
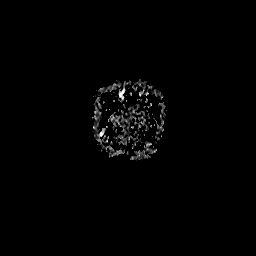

[34 of 48 positions shown; findings below may reference images not displayed]

FINDINGS: Brain: Status post resection of a left frontal lesion. Similar
appearance of the resection cavity with mild improvement in
surrounding edema. No substantial midline shift. Evolving
postoperative blood products associated with the resection cavity.
No evidence of acute infarct, hydrocephalus, or mass lesion. No
abnormal enhancement.

Vascular: Major arterial flow voids are maintained skull base.

Skull and upper cervical spine: Left frontal craniotomy. Otherwise,
normal marrow signal.

Sinuses/Orbits: Largely clear sinuses.  Unremarkable orbits.

Other: No mastoid effusions.
IMPRESSION: Similar left frontal resection cavity with mildly improved
surrounding edema. No evidence of new/interval acute abnormality.

## 2021-11-01 MED ORDER — GADOBUTROL 1 MMOL/ML IV SOLN
8.0000 mL | Freq: Once | INTRAVENOUS | Status: AC | PRN
  Administered 2021-11-01: 8 mL via INTRAVENOUS

## 2021-12-14 DEATH — deceased
